# Patient Record
Sex: Male | Born: 1980 | Race: Black or African American | Hispanic: No | Marital: Married | Smoking: Never smoker
Health system: Southern US, Community
[De-identification: ages and names within clinical notes are randomized; demographics above are authoritative.]

## PROBLEM LIST (undated history)

## (undated) DIAGNOSIS — G4733 Obstructive sleep apnea (adult) (pediatric): Secondary | ICD-10-CM

## (undated) DIAGNOSIS — I83893 Varicose veins of bilateral lower extremities with other complications: Secondary | ICD-10-CM

## (undated) DIAGNOSIS — I10 Essential (primary) hypertension: Secondary | ICD-10-CM

## (undated) DIAGNOSIS — N529 Male erectile dysfunction, unspecified: Secondary | ICD-10-CM

## (undated) DIAGNOSIS — Z9989 Dependence on other enabling machines and devices: Secondary | ICD-10-CM

## (undated) DIAGNOSIS — E119 Type 2 diabetes mellitus without complications: Secondary | ICD-10-CM

## (undated) HISTORY — DX: Morbid (severe) obesity due to excess calories: E66.01

## (undated) HISTORY — DX: Obstructive sleep apnea (adult) (pediatric): Z99.89

## (undated) HISTORY — DX: Varicose veins of bilateral lower extremities with other complications: I83.893

## (undated) HISTORY — DX: Male erectile dysfunction, unspecified: N52.9

## (undated) HISTORY — DX: Obstructive sleep apnea (adult) (pediatric): G47.33

---
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
## 2003-11-28 ENCOUNTER — Ambulatory Visit (HOSPITAL_COMMUNITY): Admission: RE | Admit: 2003-11-28 | Discharge: 2003-11-28 | Payer: Self-pay | Admitting: Family Medicine

## 2003-11-28 ENCOUNTER — Emergency Department (HOSPITAL_COMMUNITY): Admission: EM | Admit: 2003-11-28 | Discharge: 2003-11-28 | Payer: Self-pay | Admitting: Family Medicine

## 2003-12-01 ENCOUNTER — Emergency Department (HOSPITAL_COMMUNITY): Admission: EM | Admit: 2003-12-01 | Discharge: 2003-12-01 | Payer: Self-pay | Admitting: Family Medicine

## 2005-02-05 ENCOUNTER — Emergency Department (HOSPITAL_COMMUNITY): Admission: EM | Admit: 2005-02-05 | Discharge: 2005-02-05 | Payer: Self-pay | Admitting: Family Medicine

## 2005-09-27 ENCOUNTER — Emergency Department (HOSPITAL_COMMUNITY): Admission: EM | Admit: 2005-09-27 | Discharge: 2005-09-27 | Payer: Self-pay | Admitting: Family Medicine

## 2006-03-27 ENCOUNTER — Emergency Department (HOSPITAL_COMMUNITY): Admission: EM | Admit: 2006-03-27 | Discharge: 2006-03-27 | Payer: Self-pay | Admitting: Family Medicine

## 2007-09-15 ENCOUNTER — Emergency Department (HOSPITAL_COMMUNITY): Admission: EM | Admit: 2007-09-15 | Discharge: 2007-09-15 | Payer: Self-pay | Admitting: Emergency Medicine

## 2008-10-11 ENCOUNTER — Emergency Department (HOSPITAL_COMMUNITY): Admission: EM | Admit: 2008-10-11 | Discharge: 2008-10-11 | Payer: Self-pay | Admitting: Family Medicine

## 2008-10-13 ENCOUNTER — Emergency Department (HOSPITAL_COMMUNITY): Admission: EM | Admit: 2008-10-13 | Discharge: 2008-10-13 | Payer: Self-pay | Admitting: Family Medicine

## 2008-10-16 ENCOUNTER — Emergency Department (HOSPITAL_COMMUNITY): Admission: EM | Admit: 2008-10-16 | Discharge: 2008-10-16 | Payer: Self-pay | Admitting: Family Medicine

## 2009-04-26 ENCOUNTER — Emergency Department (HOSPITAL_COMMUNITY): Admission: EM | Admit: 2009-04-26 | Discharge: 2009-04-26 | Payer: Self-pay | Admitting: Family Medicine

## 2009-06-01 ENCOUNTER — Emergency Department (HOSPITAL_COMMUNITY): Admission: EM | Admit: 2009-06-01 | Discharge: 2009-06-01 | Payer: Self-pay | Admitting: Emergency Medicine

## 2009-11-01 ENCOUNTER — Ambulatory Visit: Payer: Self-pay | Admitting: Internal Medicine

## 2009-11-01 DIAGNOSIS — K219 Gastro-esophageal reflux disease without esophagitis: Secondary | ICD-10-CM | POA: Insufficient documentation

## 2009-11-01 DIAGNOSIS — R635 Abnormal weight gain: Secondary | ICD-10-CM | POA: Insufficient documentation

## 2009-11-01 LAB — CONVERTED CEMR LAB
ALT: 19 units/L (ref 0–53)
AST: 23 units/L (ref 0–37)
Albumin: 4.6 g/dL (ref 3.5–5.2)
Alkaline Phosphatase: 50 units/L (ref 39–117)
BUN: 10 mg/dL (ref 6–23)
Basophils Absolute: 0 10*3/uL (ref 0.0–0.1)
Basophils Relative: 0.2 % (ref 0.0–3.0)
Bilirubin, Direct: 0.1 mg/dL (ref 0.0–0.3)
CO2: 33 meq/L — ABNORMAL HIGH (ref 19–32)
Calcium: 9.7 mg/dL (ref 8.4–10.5)
Chloride: 97 meq/L (ref 96–112)
Cholesterol: 167 mg/dL (ref 0–200)
Creatinine, Ser: 1.1 mg/dL (ref 0.4–1.5)
Eosinophils Absolute: 0 10*3/uL (ref 0.0–0.7)
Eosinophils Relative: 1 % (ref 0.0–5.0)
GFR calc non Af Amer: 102.59 mL/min (ref >60)
Glucose, Bld: 94 mg/dL (ref 70–99)
HCT: 44.5 % (ref 39.0–52.0)
HDL: 39.8 mg/dL (ref >39.00)
Hemoglobin: 14.4 g/dL (ref 13.0–17.0)
LDL Cholesterol: 98 mg/dL (ref 0–99)
Lymphocytes Relative: 41.4 % (ref 12.0–46.0)
Lymphs Abs: 1.9 10*3/uL (ref 0.7–4.0)
MCHC: 32.3 g/dL (ref 30.0–36.0)
MCV: 91.6 fL (ref 78.0–100.0)
Monocytes Absolute: 0.3 10*3/uL (ref 0.1–1.0)
Monocytes Relative: 5.6 % (ref 3.0–12.0)
Neutro Abs: 2.3 10*3/uL (ref 1.4–7.7)
Neutrophils Relative %: 51.8 % (ref 43.0–77.0)
Platelets: 218 10*3/uL (ref 150.0–400.0)
Potassium: 4.3 meq/L (ref 3.5–5.1)
RBC: 4.86 M/uL (ref 4.22–5.81)
RDW: 12.3 % (ref 11.5–14.6)
Sodium: 137 meq/L (ref 135–145)
TSH: 1.76 microintl units/mL (ref 0.35–5.50)
Total Bilirubin: 0.7 mg/dL (ref 0.3–1.2)
Total CHOL/HDL Ratio: 4
Total Protein: 7.8 g/dL (ref 6.0–8.3)
Triglycerides: 147 mg/dL (ref 0.0–149.0)
VLDL: 29.4 mg/dL (ref 0.0–40.0)
WBC: 4.5 10*3/uL (ref 4.5–10.5)

## 2009-11-02 ENCOUNTER — Encounter: Payer: Self-pay | Admitting: Internal Medicine

## 2009-12-01 ENCOUNTER — Emergency Department (HOSPITAL_COMMUNITY): Admission: EM | Admit: 2009-12-01 | Discharge: 2009-12-01 | Payer: Self-pay | Admitting: Family Medicine

## 2010-04-29 ENCOUNTER — Emergency Department (HOSPITAL_COMMUNITY): Admission: EM | Admit: 2010-04-29 | Discharge: 2010-04-29 | Payer: Self-pay | Admitting: Family Medicine

## 2010-11-20 NOTE — Letter (Signed)
Summary: Lipid Letter  [facility]  [address] [place], [place] [address]   Phone: [phone]  Fax: [phone]    11/02/2009  Mak Bonny [address] [place], [place]  [address]  Dear Renae Fickle:  We have carefully reviewed your last lipid profile from 11/01/2009 and the results are noted below with a summary of recommendations for lipid management.    Cholesterol:       167     Goal: <200   HDL "good" Cholesterol:   57.84     Goal: >40   LDL "bad" Cholesterol:   98     Goal: <130   Triglycerides:       147.0     Goal: <150    Great results! Also, your other labs are normal too.    TLC Diet (Therapeutic Lifestyle Change): Saturated Fats & Transfatty acids should be kept < 7% of total calories ***Reduce Saturated Fats Polyunstaurated Fat can be up to 10% of total calories Monounsaturated Fat Fat can be up to 20% of total calories Total Fat should be no greater than 25-35% of total calories Carbohydrates should be 50-60% of total calories Protein should be approximately 15% of total calories Fiber should be at least 20-30 grams a day ***Increased fiber may help lower LDL Total Cholesterol should be < 200mg /day Consider adding plant stanol/sterols to diet (example: Benacol spread) ***A higher intake of unsaturated fat may reduce Triglycerides and Increase HDL    Adjunctive Measures (may lower LIPIDS and reduce risk of Heart Attack) include: Aerobic Exercise (20-30 minutes 3-4 times a week) Limit Alcohol Consumption Weight Reduction Aspirin 75-81 mg a day by mouth (if not allergic or contraindicated) Dietary Fiber 20-30 grams a day by mouth     Current Medications:  None If you have any questions, please call. We appreciate being able to work with you.   Sincerely,    [facility] Etta Grandchild MD

## 2010-11-20 NOTE — Assessment & Plan Note (Signed)
Summary: NEW PT-CPX- [facility]   Vital Signs:  Patient profile:   30 year old male Height:      73 inches Weight:      291 pounds BMI:     38.53 O2 Sat:      98 % Temp:     97.9 degrees F oral Pulse rate:   80 / minute Pulse rhythm:   regular Resp:     16 per minute  Nutrition Counseling: Patient's BMI is greater than 25 and therefore counseled on weight management options.  History of Present Illness: New to me he wants a complete physical.  Preventive Screening-Counseling & Management  Alcohol-Tobacco     Alcohol drinks/day: <1     Alcohol type: wine     >5/day in last 3 mos: no     Alcohol Counseling: not indicated; use of alcohol is not excessive or problematic     Feels need to cut down: no     Feels annoyed by complaints: no     Feels guilty re: drinking: no     Needs 'eye opener' in am: no     Smoking Status: never  Caffeine-Diet-Exercise     Does Patient Exercise: yes  Hep-HIV-STD-Contraception     Hepatitis Risk: no risk noted     HIV Risk: no risk noted     STD Risk: no risk noted     TSE monthly: yes     Testicular SE Education/Counseling to perform regular STE  Safety-Violence-Falls     Seat Belt Use: yes     Helmet Use: yes     Firearms in the Home: no firearms in the home     Smoke Detectors: yes     Violence in the Home: no risk noted     Sexual Abuse: no      Sexual History:  currently monogamous.        Drug Use:  never.        Blood Transfusions:  no.    Current Medications (verified): 1)  None  Allergies (verified): No Known Drug Allergies  Past History:  Past Medical History: GERD  Past Surgical History: Denies surgical history  Family History: Family History Hypertension  Social History: Smoking Status:  never Does Patient Exercise:  yes Hepatitis Risk:  no risk noted HIV Risk:  no risk noted STD Risk:  no risk noted Seat Belt Use:  yes Sexual History:  currently monogamous Drug Use:  never Blood  Transfusions:  no  Review of Systems       The patient complains of weight gain and severe indigestion/heartburn.  The patient denies anorexia, fever, weight loss, chest pain, syncope, dyspnea on exertion, peripheral edema, prolonged cough, headaches, hemoptysis, abdominal pain, melena, hematochezia, hematuria, difficulty walking, depression, abnormal bleeding, enlarged lymph nodes, angioedema, and testicular masses.    Physical Exam  General:  alert, well-developed, well-nourished, well-hydrated, appropriate dress, normal appearance, healthy-appearing, cooperative to examination, good hygiene, and overweight-appearing.   Head:  normocephalic, atraumatic, no abnormalities observed, and no abnormalities palpated.   Eyes:  No corneal or conjunctival inflammation noted. EOMI. Perrla. Funduscopic exam benign, without hemorrhages, exudates or papilledema. Vision grossly normal. Mouth:  Oral mucosa and oropharynx without lesions or exudates.  Teeth in good repair. Neck:  supple, full ROM, no masses, no thyromegaly, no thyroid nodules or tenderness, no JVD, normal carotid upstroke, no carotid bruits, no cervical lymphadenopathy, and no neck tenderness.   Chest Wall:  No deformities, masses, tenderness or  gynecomastia noted. Breasts:  No masses or gynecomastia noted Lungs:  Normal respiratory effort, chest expands symmetrically. Lungs are clear to auscultation, no crackles or wheezes. Heart:  Normal rate and regular rhythm. S1 and S2 normal without gallop, murmur, click, rub or other extra sounds. Abdomen:  soft, non-tender, normal bowel sounds, no distention, no masses, no guarding, no rigidity, no rebound tenderness, no abdominal hernia, no inguinal hernia, no hepatomegaly, and no splenomegaly.   Genitalia:  circumcised, no hydrocele, no varicocele, no scrotal masses, no testicular masses or atrophy, no cutaneous lesions, and no urethral discharge.   Msk:  normal ROM, no joint tenderness, no joint  swelling, no joint warmth, no redness over joints, no joint instability, and no crepitation.   Pulses:  R and L carotid,radial,femoral,dorsalis pedis and posterior tibial pulses are full and equal bilaterally Extremities:  No clubbing, cyanosis, edema, or deformity noted with normal full range of motion of all joints.   Neurologic:  No cranial nerve deficits noted. Station and gait are normal. Plantar reflexes are down-going bilaterally. DTRs are symmetrical throughout. Sensory, motor and coordinative functions appear intact. Skin:  turgor normal, color normal, no rashes, no suspicious lesions, no ecchymoses, no petechiae, no purpura, no ulcerations, no edema, and tattoo(s).   Cervical Nodes:  no anterior cervical adenopathy and no posterior cervical adenopathy.   Axillary Nodes:  no R axillary adenopathy and no L axillary adenopathy.   Inguinal Nodes:  no R inguinal adenopathy and no L inguinal adenopathy.   Psych:  Cognition and judgment appear intact. Alert and cooperative with normal attention span and concentration. No apparent delusions, illusions, hallucinations Additional Exam:  EKG is normal.   Impression & Recommendations:  Problem # 1:  ROUTINE GENERAL MEDICAL EXAM@HEALTH  CARE FACL (ICD-V70.0) Assessment New  Orders: Venipuncture (11914) TLB-Lipid Panel (80061-LIPID) TLB-BMP (Basic Metabolic Panel-BMET) (80048-METABOL) TLB-CBC Platelet - w/Differential (85025-CBCD) TLB-Hepatic/Liver Function Pnl (80076-HEPATIC) TLB-TSH (Thyroid Stimulating Hormone) (84443-TSH) EKG w/ Interpretation (93000)  Td Booster: Td (05/12/2002)    Discussed using sunscreen, use of alcohol, drug use, self testicular exam, routine dental care, routine eye care, routine physical exam, seat belts, multiple vitamins, and recommendations for immunizations.  Discussed exercise and checking cholesterol.  Discussed gun safety, safe sex.  Problem # 2:  WEIGHT GAIN, ABNORMAL (ICD-783.1) Assessment:  New  Orders: Venipuncture (78295) TLB-Lipid Panel (80061-LIPID) TLB-BMP (Basic Metabolic Panel-BMET) (80048-METABOL) TLB-CBC Platelet - w/Differential (85025-CBCD) TLB-Hepatic/Liver Function Pnl (80076-HEPATIC) TLB-TSH (Thyroid Stimulating Hormone) (62130-QMV)  Patient Instructions: 1)  Please schedule a follow-up appointment as needed. 2)  It is important that you exercise regularly at least 20 minutes 5 times a week. If you develop chest pain, have severe difficulty breathing, or feel very tired , stop exercising immediately and seek medical attention. 3)  You need to lose weight. Consider a lower calorie diet and regular exercise.  4)  Avoid foods high in acid (tomatoes, citrus juices, spicy foods). Avoid eating within two hours of lying down or before exercising. Do not over eat; try smaller more frequent meals. Elevate head of bed twelve inches when sleeping.   Tetanus/Td Immunization History:    Tetanus/Td # 1:  Td (05/12/2002)

## 2011-05-16 ENCOUNTER — Emergency Department (HOSPITAL_COMMUNITY)
Admission: EM | Admit: 2011-05-16 | Discharge: 2011-05-16 | Disposition: A | Discharge status: Home / Self Care | Payer: No Typology Code available for payment source | Attending: Emergency Medicine | Admitting: Emergency Medicine

## 2011-05-16 ENCOUNTER — Emergency Department (HOSPITAL_COMMUNITY): Payer: No Typology Code available for payment source

## 2011-05-16 DIAGNOSIS — R079 Chest pain, unspecified: Secondary | ICD-10-CM | POA: Insufficient documentation

## 2011-05-16 DIAGNOSIS — M542 Cervicalgia: Secondary | ICD-10-CM | POA: Insufficient documentation

## 2011-05-16 DIAGNOSIS — M549 Dorsalgia, unspecified: Secondary | ICD-10-CM | POA: Insufficient documentation

## 2011-05-20 ENCOUNTER — Ambulatory Visit
Admission: RE | Admit: 2011-05-20 | Discharge: 2011-05-20 | Disposition: A | Discharge status: Home / Self Care | Payer: BC Managed Care – PPO | Source: Ambulatory Visit | Attending: Emergency Medicine | Admitting: Emergency Medicine

## 2011-05-20 ENCOUNTER — Other Ambulatory Visit: Payer: Self-pay | Admitting: Emergency Medicine

## 2011-07-26 LAB — COMPREHENSIVE METABOLIC PANEL
ALT: 20 U/L (ref 0–53)
Alkaline Phosphatase: 40 U/L (ref 39–117)
CO2: 27 mEq/L (ref 19–32)
Chloride: 102 mEq/L (ref 96–112)
GFR calc non Af Amer: 59 mL/min — ABNORMAL LOW (ref >60)
Glucose, Bld: 116 mg/dL — ABNORMAL HIGH (ref 70–99)
Potassium: 3.6 mEq/L (ref 3.5–5.1)
Sodium: 137 mEq/L (ref 135–145)
Total Bilirubin: 0.6 mg/dL (ref 0.3–1.2)
Total Protein: 7 g/dL (ref 6.0–8.3)

## 2011-07-26 LAB — CULTURE, BLOOD (ROUTINE X 2)

## 2011-07-26 LAB — URINALYSIS, ROUTINE W REFLEX MICROSCOPIC
Ketones, ur: 15 mg/dL — AB
Nitrite: NEGATIVE
Protein, ur: NEGATIVE mg/dL
pH: 6.5 (ref 5.0–8.0)

## 2011-07-26 LAB — CBC
Hemoglobin: 12.7 g/dL — ABNORMAL LOW (ref 13.0–17.0)
RBC: 4.32 MIL/uL (ref 4.22–5.81)

## 2011-07-26 LAB — DIFFERENTIAL
Basophils Relative: 0 % (ref 0–1)
Eosinophils Absolute: 0.1 10*3/uL (ref 0.0–0.7)
Monocytes Relative: 11 % (ref 3–12)
Neutrophils Relative %: 84 % — ABNORMAL HIGH (ref 43–77)

## 2011-07-26 LAB — POCT I-STAT, CHEM 8
Calcium, Ion: 1.1 mmol/L — ABNORMAL LOW (ref 1.12–1.32)
Chloride: 100 mEq/L (ref 96–112)
HCT: 41 % (ref 39.0–52.0)
Hemoglobin: 13.9 g/dL (ref 13.0–17.0)
TCO2: 26 mmol/L (ref 0–100)

## 2011-07-26 LAB — CULTURE, ROUTINE-ABSCESS

## 2012-05-08 ENCOUNTER — Ambulatory Visit (INDEPENDENT_AMBULATORY_CARE_PROVIDER_SITE_OTHER): Payer: BC Managed Care – PPO | Admitting: Family Medicine

## 2012-05-08 VITALS — BP 118/72 | HR 86 | Temp 98.5°F | Resp 16 | Ht 73.0 in | Wt 309.0 lb

## 2012-05-08 DIAGNOSIS — J029 Acute pharyngitis, unspecified: Secondary | ICD-10-CM

## 2012-05-08 LAB — POCT RAPID STREP A (OFFICE): Rapid Strep A Screen: NEGATIVE

## 2012-05-08 MED ORDER — PREDNISONE 20 MG PO TABS: ORAL_TABLET | ORAL | Status: DC

## 2012-05-08 NOTE — Progress Notes (Signed)
Subjective: 30 year old male with history of sore throat for 3 days. They have a 53-month-old newborn home and he wants make sure he doesn't have strep. 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 He has not been running a fever that he knows of. It started in the throat it still hurts a lot in his throat and upper neck anteriorly. He also has sinus congestion and drainage today.  Patient also complains of fatigue and says he is here physical exam some time. We talked about 2 to get that with. It's her nonspecific fatigue,  Objective: Afro-American male in no acute distress. His TMs are normal. Sinuses nontender. Throat mildly erythematous. Has a little bit of pus on the right tonsil. Neck supple without significant nodes but he is tender in the submandibular area. Chest is clear to auscultation. Heart regular without murmurs.    Assessment: Pharyngitis, etiology unclear Fatigue, nonspecific  Plan:  Check strep screen and proceed accordingly Results for orders placed in visit on 05/08/12  POCT RAPID STREP A (OFFICE)      Component Value Range   Rapid Strep A Screen Negative  Negative   Get PE sometime Exercise, watch diet, avoid liquid calories, get sufficient sleep.  Prednisone 20 tid for 2 days.

## 2012-05-08 NOTE — Patient Instructions (Addendum)

## 2013-07-16 ENCOUNTER — Ambulatory Visit: Payer: Self-pay | Admitting: Family Medicine

## 2013-07-16 VITALS — BP 124/80 | HR 86 | Temp 98.1°F | Resp 18 | Ht 73.5 in | Wt 335.0 lb

## 2013-07-16 DIAGNOSIS — Z0289 Encounter for other administrative examinations: Secondary | ICD-10-CM

## 2013-07-16 DIAGNOSIS — Z024 Encounter for examination for driving license: Secondary | ICD-10-CM

## 2013-07-16 NOTE — Progress Notes (Signed)
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  History: No major medical complaints  Past history: No major operations, hospitalizations, or major illnesses. No allergies No regular medications  Social history: Married with 2 children. Drives for [employer]  Review of systems: Constitutional, HEENT, respiratory, cardiovascular, gastrointestinal, genitourinary, muscular skeletal, endocrinologic, neurologic, psychiatric all negative  Overweight male in no acute distress. TMs normal. Eyes PERRLA. Fundi benign. Throat clear. Neck supple without nodes thyromegaly. No carotid bruits. Chest is clear to auscultation. Heart regular without murmurs gallops or arrhythmias.  Abdomen soft without masses or tenderness. Normal male external genitalia. No hernias. Skin unremarkable. Neurologic normal. Neck circumference 19.5  Assessment: Normal DOT physical in a patient with a large neck circumference Obesity  Plan: He is asymptomatic for sleep problems. Stressed with a history of losing weight. Told him this if he does not succeed at that and he develops any sleep symptoms he'll need sleep studies done. He understands the knee. See him back in 2 years. Card completed.

## 2015-09-23 ENCOUNTER — Ambulatory Visit (INDEPENDENT_AMBULATORY_CARE_PROVIDER_SITE_OTHER): Payer: BLUE CROSS/BLUE SHIELD | Admitting: Physician Assistant

## 2015-09-23 VITALS — BP 130/84 | HR 88 | Temp 98.6°F | Resp 16 | Ht 73.5 in | Wt 361.4 lb

## 2015-09-23 DIAGNOSIS — H11003 Unspecified pterygium of eye, bilateral: Secondary | ICD-10-CM | POA: Diagnosis not present

## 2015-09-23 DIAGNOSIS — J029 Acute pharyngitis, unspecified: Secondary | ICD-10-CM

## 2015-09-23 LAB — POCT RAPID STREP A (OFFICE): RAPID STREP A SCREEN: NEGATIVE

## 2015-09-23 MED ORDER — IBUPROFEN 600 MG PO TABS
600.0000 mg | ORAL_TABLET | Freq: Three times a day (TID) | ORAL | Status: DC | PRN
Start: 2015-09-23 — End: 2017-05-29

## 2015-09-23 MED ORDER — CETIRIZINE HCL 10 MG PO TABS: 10.0000 mg | ORAL_TABLET | Freq: Every day | ORAL | Status: DC

## 2015-09-23 NOTE — Patient Instructions (Signed)
Pterygium Excision °Pterygium excision is a surgical procedure to remove a pterygium, which is a noncancerous (benign) fleshy growth on the front surface of the eye. A pterygium can start on the clear outer tissue of the eye (conjunctiva). It spreads to cover the white part of the eye (sclera) and extends onto the clear tissue that covers the pupil (cornea). °You may need this surgery if you have a pterygium that is causing discomfort or affecting your vision. Pterygium excision may be needed if other treatments are not working. You may also choose to have this surgery to improve the appearance of your eye (cosmetic surgery). °LET YOUR HEALTH CARE PROVIDER KNOW ABOUT: °· Any allergies you have. °· All medicines you are taking, including vitamins, herbs, eye drops, creams, and over-the-counter medicines. °· Previous problems you or members of your family have had with the use of anesthetics. °· Any blood disorders you have. °· Previous surgeries you have had. °· Any medical conditions you may have. °RISKS AND COMPLICATIONS °Generally, this is a safe procedure. However, problems may occur, including: °· Having the pterygium come back after surgery. °· Eye pain. °· Vision changes (astigmatism). °· Feeling like there is something in your eye. °· Scar formation on the eye (granuloma). °BEFORE THE PROCEDURE °· Ask your health care provider about: °¨ Changing or stopping your regular medicines. This is especially important if you are taking diabetes medicines or blood thinners. °¨ Taking medicines such as aspirin and ibuprofen. These medicines can thin your blood. Do not take these medicines before your procedure if your health care provider instructs you not to. °· Plan to have someone take you home after the procedure. °· If you go home right after the procedure, plan to have someone with you for 24 hours. °PROCEDURE °· An IV tube will be inserted into one of your veins. °· You will be given one or more of the  following: °¨ A medicine that helps you relax (sedative). °¨ A medicine that numbs the area (local anesthetic). °· After your eye is numb, your health care provider will use a device (retractor) to hold your eyelid open. °· The pterygium will be removed and lifted away from your eye. °· You may have a piece of eye tissue (graft) attached to the surface of your eye where the pterygium was removed. This graft may be taken from the conjunctiva at the outer lining of your eye on the side that is away from your pterygium excision. °· The graft may be held in place with tiny absorbable stitches (sutures) or with a type of glue. °· Your eye will be closed, and an eye patch will be placed over your eye. °The procedure may vary among health care providers and hospitals. °AFTER THE PROCEDURE °· Your blood pressure, heart rate, breathing rate, and blood oxygen level will be monitored often until the medicines you were given have worn off.  °· You will be given pain medicine as needed. °· You will need to wear your eye patch as directed by your health care provider. °  °This information is not intended to replace advice given to you by your health care provider. Make sure you discuss any questions you have with your health care provider. °  °Document Released: 07/02/2001 Document Revised: 02/21/2015 Document Reviewed: 06/22/2014 °Elsevier Interactive Patient Education ©2016 [employer] ° °

## 2015-09-23 NOTE — Progress Notes (Signed)
09/23/2015 7:03 PM   DOB: 04-22-1981 / MRN: [id number]  SUBJECTIVE:  Kyle Marsh is a 34 y.o. male presenting for for the evaluation of sore throat that started 2 weeks ago.  Associated symptoms include congestion and sneezing today and he denies cough. Treatments tried thus far include nothing with poor relief. He denies sick contacts. No personal history of HTN and diabetes.   He complains of white spots in his eye that appeared on the right eye two days ago.  Denies any frank eye pain and photophobia, nausea and emesis.    He has No Known Allergies.   He  has no past medical history on file.    He  reports that he has never smoked. He does not have any smokeless tobacco history on file. He  has no sexual activity history on file. The patient  has no past surgical history on file.  His family history is not on file.  Review of Systems  Constitutional: Negative for fever and chills.  Eyes: Negative for blurred vision, double vision, photophobia, pain, discharge and redness.  Respiratory: Negative for cough and shortness of breath.   Cardiovascular: Negative for chest pain.  Gastrointestinal: Negative for nausea and abdominal pain.  Genitourinary: Negative for dysuria, urgency and frequency.  Musculoskeletal: Negative for myalgias.  Skin: Negative for rash.  Neurological: Negative for dizziness, tingling and headaches.  Psychiatric/Behavioral: Negative for depression. The patient is not nervous/anxious.     Problem list and medications reviewed and updated by myself where necessary, and exist elsewhere in the encounter.   OBJECTIVE:  BP 130/84 mmHg  Pulse 88  Temp(Src) 98.6 F (37 C) (Oral)  Resp 16  Ht 6' 1.5" (1.867 m)  Wt 361 lb 6.4 oz (163.93 kg)  BMI 47.03 kg/m2  SpO2 96%   Physical Exam  Constitutional: He is oriented to person, place, and time. He appears well-developed.  HENT:  Right Ear: Hearing, tympanic membrane and ear canal normal.  Left Ear: Hearing,  tympanic membrane and ear canal normal.  Mouth/Throat: Uvula is midline and mucous membranes are normal.  Eyes: Conjunctivae, EOM and lids are normal. Pupils are equal, round, and reactive to light. Right eye exhibits no discharge. Left eye exhibits no discharge. No scleral icterus.  Fundoscopic exam:      The right eye shows no AV nicking, no exudate and no hemorrhage.       The left eye shows no AV nicking, no exudate and no hemorrhage.  Slit lamp exam:      The right eye shows no corneal abrasion, no corneal flare, no corneal ulcer and no fluorescein uptake.    Neck: Trachea normal and normal range of motion. Neck supple. Carotid bruit is not present.  Cardiovascular: Normal rate and normal pulses.   No murmur heard. Pulmonary/Chest: Effort normal and breath sounds normal. He has no rhonchi.  Abdominal: Normal appearance. He exhibits no abdominal bruit. There is no tenderness.  Musculoskeletal: Normal range of motion. He exhibits no edema or tenderness.  Lymphadenopathy:       Head (right side): No submental, no submandibular, no tonsillar, no preauricular, no posterior auricular and no occipital adenopathy present.       Head (left side): No submental, no submandibular, no tonsillar, no preauricular, no posterior auricular and no occipital adenopathy present.    He has no cervical adenopathy.  Neurological: He is alert and oriented to person, place, and time. He has normal strength. No cranial nerve deficit or  sensory deficit. Coordination and gait normal.  Skin: Skin is warm, dry and intact. No lesion and no rash noted.  Psychiatric: He has a normal mood and affect. His speech is normal and behavior is normal. Judgment and thought content normal.    Results for orders placed or performed in visit on 09/23/15 (from the past 48 hour(s))  POCT rapid strep A     Status: Normal   Collection Time: 09/23/15  4:42 PM  Result Value Ref Range   Rapid Strep A Screen Negative Negative     ASSESSMENT AND PLAN:  Frankey was seen today for sore throat and eye problem.  Diagnoses and all orders for this visit:  Sore throat: Rapid negative thus far and his throat is unimpressive.  This is likely viral in nature.  Will treat symptomatically for now.  Ibuprofen 800 mg and zyrtec 10 mg.  Will make him aware of strep results and will treat accordingly.  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  Advised that he wear sunglasses. Will send to Dr. Dione Booze per patient's request for a routine evaluation per patient request.       The patient was advised to call or return to clinic if he does not see an improvement in symptoms or to seek the care of the closest emergency department if he worsens with the above plan.   Deliah Boston, MHS, PA-C [facility] 09/23/2015 7:03 PM

## 2015-09-26 ENCOUNTER — Other Ambulatory Visit: Payer: Self-pay | Admitting: Physician Assistant

## 2015-09-26 DIAGNOSIS — J020 Streptococcal pharyngitis: Secondary | ICD-10-CM

## 2015-09-26 LAB — CULTURE, GROUP A STREP

## 2015-09-26 MED ORDER — AMOXICILLIN 875 MG PO TABS: 875.0000 mg | ORAL_TABLET | Freq: Two times a day (BID) | ORAL | Status: DC

## 2015-09-27 NOTE — Progress Notes (Signed)
Left message on machine for patient call back.

## 2015-09-27 NOTE — Progress Notes (Signed)
Pt notified of results

## 2016-02-03 ENCOUNTER — Ambulatory Visit (INDEPENDENT_AMBULATORY_CARE_PROVIDER_SITE_OTHER): Payer: BLUE CROSS/BLUE SHIELD | Admitting: Emergency Medicine

## 2016-02-03 ENCOUNTER — Telehealth: Payer: Self-pay

## 2016-02-03 VITALS — BP 130/80 | HR 102 | Temp 97.8°F | Resp 18 | Ht 73.25 in | Wt 373.4 lb

## 2016-02-03 DIAGNOSIS — J020 Streptococcal pharyngitis: Secondary | ICD-10-CM

## 2016-02-03 DIAGNOSIS — J029 Acute pharyngitis, unspecified: Secondary | ICD-10-CM | POA: Diagnosis not present

## 2016-02-03 LAB — POCT RAPID STREP A (OFFICE): Rapid Strep A Screen: POSITIVE — AB

## 2016-02-03 MED ORDER — CEFTRIAXONE SODIUM 1 G IJ SOLR
1.0000 g | Freq: Once | INTRAMUSCULAR | Status: AC
  Administered 2016-02-03: 1 g via INTRAMUSCULAR

## 2016-02-03 MED ORDER — AMOXICILLIN-POT CLAVULANATE 875-125 MG PO TABS: 1.0000 | ORAL_TABLET | Freq: Two times a day (BID) | ORAL | Status: DC

## 2016-02-03 NOTE — Patient Instructions (Addendum)
If you start to have difficulty swallowing liquids and unable to keep down fluids please go to the emergency room. If you are continuing to have trouble by Monday morning please return to clinic for reevaluation.   Strep Throat Strep throat is a bacterial infection of the throat. Your health care provider may call the infection tonsillitis or pharyngitis, depending on whether there is swelling in the tonsils or at the back of the throat. Strep throat is most common during the cold months of the year in children who are 205-34 years of age, but it can happen during any season in people of any age. This infection is spread from person to person (contagious) through coughing, sneezing, or close contact. CAUSES Strep throat is caused by the bacteria called Streptococcus pyogenes. RISK FACTORS This condition is more likely to develop in:  People who spend time in crowded places where the infection can spread easily.  People who have close contact with someone who has strep throat. SYMPTOMS Symptoms of this condition include:  Fever or chills.   Redness, swelling, or pain in the tonsils or throat.  Pain or difficulty when swallowing.  White or yellow spots on the tonsils or throat.  Swollen, tender glands in the neck or under the jaw.  Red rash all over the body (rare). DIAGNOSIS This condition is diagnosed by performing a rapid strep test or by taking a swab of your throat (throat culture test). Results from a rapid strep test are usually ready in a few minutes, but throat culture test results are available after one or two days. TREATMENT This condition is treated with antibiotic medicine. HOME CARE INSTRUCTIONS Medicines  Take over-the-counter and prescription medicines only as told by your health care provider.  Take your antibiotic as told by your health care provider. Do not stop taking the antibiotic even if you start to feel better.  Have family members who also have a sore  throat or fever tested for strep throat. They may need antibiotics if they have the strep infection. Eating and Drinking  Do not share food, drinking cups, or personal items that could cause the infection to spread to other people.  If swallowing is difficult, try eating soft foods until your sore throat feels better.  Drink enough fluid to keep your urine clear or pale yellow. General Instructions  Gargle with a salt-water mixture 3-4 times per day or as needed. To make a salt-water mixture, completely dissolve -1 tsp of salt in 1 cup of warm water.  Make sure that all household members wash their hands well.  Get plenty of rest.  Stay home from school or work until you have been taking antibiotics for 24 hours.  Keep all follow-up visits as told by your health care provider. This is important. SEEK MEDICAL CARE IF:  The glands in your neck continue to get bigger.  You develop a rash, cough, or earache.  You cough up a thick liquid that is green, yellow-brown, or bloody.  You have pain or discomfort that does not get better with medicine.  Your problems seem to be getting worse rather than better.  You have a fever. SEEK IMMEDIATE MEDICAL CARE IF:  You have new symptoms, such as vomiting, severe headache, stiff or painful neck, chest pain, or shortness of breath.  You have severe throat pain, drooling, or changes in your voice.  You have swelling of the neck, or the skin on the neck becomes red and tender.  You have  signs of dehydration, such as fatigue, dry mouth, and decreased urination.  You become increasingly sleepy, or you cannot wake up completely.  Your joints become red or painful.   This information is not intended to replace advice given to you by your health care provider. Make sure you discuss any questions you have with your health care provider.   Document Released: 10/04/2000 Document Revised: 06/28/2015 Document Reviewed: 01/30/2015 Elsevier  Interactive Patient Education 2016 [employer].     IF you received an x-ray today, you will receive an invoice from [facility]. Please contact [facility] at [phone] with questions or concerns regarding your invoice.   IF you received labwork today, you will receive an invoice from [employer]. Please contact [facility] at [phone] with questions or concerns regarding your invoice.   Our billing staff will not be able to assist you with questions regarding bills from these companies.  You will be contacted with the lab results as soon as they are available. The fastest way to get your results is to activate your My Chart account. Instructions are located on the last page of this paperwork. If you have not heard from [place] regarding the results in 2 weeks, please contact this office.

## 2016-02-03 NOTE — Addendum Note (Signed)
Addended by: Lesle ChrisAUB, Zykerria Tanton A on: 02/03/2016 12:41 PM   Modules accepted: Kipp BroodSmartSet

## 2016-02-03 NOTE — Telephone Encounter (Signed)
Called to confirm pt picked up Augmentin rx, per Dr. Cleta Albertsaub. Pt had picked up rx.

## 2016-02-03 NOTE — Addendum Note (Signed)
Addended by: Mila MerryHARRELL, Jossette Zirbel on: 02/03/2016 12:16 PM   Modules accepted: Kipp BroodSmartSet

## 2016-02-03 NOTE — Progress Notes (Signed)
By signing my name below, I, Raven Small, attest that this documentation has been prepared under the direction and in the presence of Lesle Chris, MD.  Electronically Signed: Andrew Au, ED Scribe. 01/29/2016. 11:21 AM.  Chief Complaint:  Chief Complaint  Patient presents with  . Sore Throat    Started 3 days ago-son dx'd with strep this week  . Nasal Congestion  . Fatigue    HPI: Kyle Marsh is a 35 y.o. male who reports to [facility] today complaining of asore throat that began 3 days ago. Pt reports associated difficulty swallowing, nasal congestion, hoarseness and cough. He has taken tylenol, allergy medication and nasal spray. He reports sick contacts at home of his son who is also being treated for strep. He denies hx of allergies. He denies fever.   No past medical history on file. No past surgical history on file. Social History   Social History  . Marital Status: Single    Spouse Name: N/A  . Number of Children: N/A  . Years of Education: N/A   Social History Main Topics  . Smoking status: Never Smoker   . Smokeless tobacco: None  . Alcohol Use: None  . Drug Use: None  . Sexual Activity: Not Asked   Other Topics Concern  . None   Social History Narrative   No family history on file. No Known Allergies Prior to Admission medications   Medication Sig Start Date End Date Taking? Authorizing Provider  acetaminophen (TYLENOL) 325 MG tablet Take 650 mg by mouth every 6 (six) hours as needed.   Yes Historical Provider, MD  cetirizine (ZYRTEC) 10 MG tablet Take 1 tablet (10 mg total) by mouth daily. 09/23/15  Yes Ofilia Neas, PA-C  fluticasone (FLONASE) 50 MCG/ACT nasal spray Place 2 sprays into both nostrils daily.   Yes Historical Provider, MD  ibuprofen (ADVIL,MOTRIN) 600 MG tablet Take 1 tablet (600 mg total) by mouth every 8 (eight) hours as needed. 09/23/15  Yes Ofilia Neas, PA-C  Multiple Vitamin (ONE-A-DAY MENS PO) Take by mouth.   Yes Historical  Provider, MD  amoxicillin (AMOXIL) 875 MG tablet Take 1 tablet (875 mg total) by mouth 2 (two) times daily. Patient not taking: Reported on 02/03/2016 09/26/15   Ofilia Neas, PA-C     ROS: The patient denies fevers, chills, night sweats, unintentional weight loss, chest pain, palpitations, wheezing, dyspnea on exertion, nausea, vomiting, abdominal pain, dysuria, hematuria, melena, numbness, weakness, or tingling.   All other systems have been reviewed and were otherwise negative with the exception of those mentioned in the HPI and as above.    PHYSICAL EXAM: Filed Vitals:   02/03/16 1108  BP: 130/80  Pulse: 102  Temp: 97.8 F (36.6 C)  Resp: 18   Body mass index is 48.9 kg/(m^2).   General: Alert, no acute distress HEENT:  Normocephalic, atraumatic, oropharynx patent.  Hoarseness to voice. Tonsil 3 + more swelling on right with redness Bilateral AC nodes. Eye: Nonie Hoyer [facility] Cardiovascular:  Regular rate and rhythm, no rubs murmurs or gallops.  No Carotid bruits, radial pulse intact. No pedal edema.  Respiratory: Clear to auscultation bilaterally.  No wheezes, rales, or rhonchi.  No cyanosis, no use of accessory musculature Abdominal: No organomegaly, abdomen is soft and non-tender, positive bowel sounds.  No masses. Musculoskeletal: Gait intact. No edema, tenderness Skin: No rashes. Neurologic: Facial musculature symmetric. Psychiatric: Patient acts appropriately throughout our interaction. Lymphatic: No cervical or submandibular lymphadenopathy  LABS: Results for orders placed or performed in visit on 02/03/16  POCT rapid strep A  Result Value Ref Range   Rapid Strep A Screen Positive (A) Negative    ASSESSMENT/PLAN: Patient tested positive for strep. We'll give him 1 g Rocephin along with Augmentin  twice a day. Recheck 48 hours if not significantly better. He is to go to the emergency room if unable to tolerate fluids. He will also be on 2 Aleve twice a day .I  personally performed the services described in this documentation, which was scribed in my presence. The recorded information has been reviewed and is accurate.   Gross sideeffects, risk and benefits, and alternatives of medications d/w patient. Patient is aware that all medications have potential sideeffects and we are unable to predict every sideeffect or drug-drug interaction that may occur.  Lesle ChrisSteven Daub MD 02/03/2016 11:19 AM

## 2017-02-05 DIAGNOSIS — I10 Essential (primary) hypertension: Secondary | ICD-10-CM | POA: Insufficient documentation

## 2017-05-28 ENCOUNTER — Emergency Department (HOSPITAL_COMMUNITY): Payer: PRIVATE HEALTH INSURANCE

## 2017-05-28 ENCOUNTER — Encounter (HOSPITAL_COMMUNITY): Payer: Self-pay | Admitting: Emergency Medicine

## 2017-05-28 DIAGNOSIS — R609 Edema, unspecified: Secondary | ICD-10-CM | POA: Insufficient documentation

## 2017-05-28 DIAGNOSIS — I10 Essential (primary) hypertension: Secondary | ICD-10-CM | POA: Diagnosis not present

## 2017-05-28 DIAGNOSIS — R2243 Localized swelling, mass and lump, lower limb, bilateral: Secondary | ICD-10-CM | POA: Diagnosis present

## 2017-05-28 DIAGNOSIS — Z791 Long term (current) use of non-steroidal anti-inflammatories (NSAID): Secondary | ICD-10-CM | POA: Diagnosis not present

## 2017-05-28 DIAGNOSIS — Z79899 Other long term (current) drug therapy: Secondary | ICD-10-CM | POA: Insufficient documentation

## 2017-05-28 DIAGNOSIS — R05 Cough: Secondary | ICD-10-CM | POA: Diagnosis not present

## 2017-05-28 DIAGNOSIS — E119 Type 2 diabetes mellitus without complications: Secondary | ICD-10-CM | POA: Insufficient documentation

## 2017-05-28 LAB — COMPREHENSIVE METABOLIC PANEL
ALT: 27 U/L (ref 17–63)
ANION GAP: 12 (ref 5–15)
AST: 41 U/L (ref 15–41)
Albumin: 3.5 g/dL (ref 3.5–5.0)
Alkaline Phosphatase: 48 U/L (ref 38–126)
BUN: 19 mg/dL (ref 6–20)
CHLORIDE: 94 mmol/L — AB (ref 101–111)
CO2: 25 mmol/L (ref 22–32)
Calcium: 8.3 mg/dL — ABNORMAL LOW (ref 8.9–10.3)
Creatinine, Ser: 1.34 mg/dL — ABNORMAL HIGH (ref 0.61–1.24)
Glucose, Bld: 124 mg/dL — ABNORMAL HIGH (ref 65–99)
POTASSIUM: 4.1 mmol/L (ref 3.5–5.1)
SODIUM: 131 mmol/L — AB (ref 135–145)
Total Bilirubin: 0.8 mg/dL (ref 0.3–1.2)
Total Protein: 6.6 g/dL (ref 6.5–8.1)

## 2017-05-28 LAB — CBC WITH DIFFERENTIAL/PLATELET
BASOS ABS: 0 10*3/uL (ref 0.0–0.1)
Basophils Relative: 0 %
EOS PCT: 5 %
Eosinophils Absolute: 0.2 10*3/uL (ref 0.0–0.7)
HCT: 35.3 % — ABNORMAL LOW (ref 39.0–52.0)
HEMOGLOBIN: 11.5 g/dL — AB (ref 13.0–17.0)
LYMPHS ABS: 1 10*3/uL (ref 0.7–4.0)
LYMPHS PCT: 20 %
MCH: 27.9 pg (ref 26.0–34.0)
MCHC: 32.6 g/dL (ref 30.0–36.0)
MCV: 85.7 fL (ref 78.0–100.0)
Monocytes Absolute: 0.6 10*3/uL (ref 0.1–1.0)
Monocytes Relative: 13 %
NEUTROS ABS: 3.1 10*3/uL (ref 1.7–7.7)
NEUTROS PCT: 62 %
PLATELETS: 191 10*3/uL (ref 150–400)
RBC: 4.12 MIL/uL — AB (ref 4.22–5.81)
RDW: 13.6 % (ref 11.5–15.5)
WBC: 4.9 10*3/uL (ref 4.0–10.5)

## 2017-05-28 LAB — URINALYSIS, ROUTINE W REFLEX MICROSCOPIC
BILIRUBIN URINE: NEGATIVE
Glucose, UA: NEGATIVE mg/dL
Hgb urine dipstick: NEGATIVE
KETONES UR: NEGATIVE mg/dL
Leukocytes, UA: NEGATIVE
NITRITE: NEGATIVE
PH: 6 (ref 5.0–8.0)
Protein, ur: NEGATIVE mg/dL
SPECIFIC GRAVITY, URINE: 1.02 (ref 1.005–1.030)

## 2017-05-28 LAB — PROTIME-INR
INR: 1.07
Prothrombin Time: 13.9 seconds (ref 11.4–15.2)

## 2017-05-28 LAB — I-STAT CG4 LACTIC ACID, ED: LACTIC ACID, VENOUS: 1.34 mmol/L (ref 0.5–1.9)

## 2017-05-28 MED ORDER — ACETAMINOPHEN 325 MG PO TABS
650.0000 mg | ORAL_TABLET | Freq: Once | ORAL | Status: AC
Start: 2017-05-28 — End: 2017-05-28
  Administered 2017-05-28: 650 mg via ORAL

## 2017-05-28 MED ORDER — ACETAMINOPHEN 325 MG PO TABS
ORAL_TABLET | ORAL | Status: AC
  Filled 2017-05-28: qty 2

## 2017-05-28 NOTE — ED Triage Notes (Signed)
Pt c/o persistent cought, bilateral LE 10/10 pain and difficulty waking, legs are swollen, red and hot to touch, pt had a fever 100.7 on arrival to ED.

## 2017-05-29 ENCOUNTER — Emergency Department (HOSPITAL_BASED_OUTPATIENT_CLINIC_OR_DEPARTMENT_OTHER)
Admit: 2017-05-29 | Discharge: 2017-05-29 | Disposition: A | Discharge status: Home / Self Care | Payer: PRIVATE HEALTH INSURANCE

## 2017-05-29 ENCOUNTER — Emergency Department (HOSPITAL_COMMUNITY): Payer: PRIVATE HEALTH INSURANCE

## 2017-05-29 ENCOUNTER — Other Ambulatory Visit: Payer: Self-pay

## 2017-05-29 ENCOUNTER — Emergency Department (HOSPITAL_COMMUNITY)
Admission: EM | Admit: 2017-05-29 | Discharge: 2017-05-29 | Disposition: A | Discharge status: Home / Self Care | Payer: PRIVATE HEALTH INSURANCE | Attending: Emergency Medicine | Admitting: Emergency Medicine

## 2017-05-29 DIAGNOSIS — R609 Edema, unspecified: Secondary | ICD-10-CM

## 2017-05-29 DIAGNOSIS — M79609 Pain in unspecified limb: Secondary | ICD-10-CM | POA: Diagnosis not present

## 2017-05-29 HISTORY — DX: Type 2 diabetes mellitus without complications: E11.9

## 2017-05-29 HISTORY — DX: Essential (primary) hypertension: I10

## 2017-05-29 LAB — TSH: TSH: 3.393 u[IU]/mL (ref 0.350–4.500)

## 2017-05-29 LAB — BRAIN NATRIURETIC PEPTIDE: B NATRIURETIC PEPTIDE 5: 6.6 pg/mL (ref 0.0–100.0)

## 2017-05-29 LAB — D-DIMER, QUANTITATIVE (NOT AT ARMC): D DIMER QUANT: 0.98 ug{FEU}/mL — AB (ref 0.00–0.50)

## 2017-05-29 MED ORDER — IOPAMIDOL (ISOVUE-370) INJECTION 76%
INTRAVENOUS | Status: AC
  Administered 2017-05-29: 100 mL
  Filled 2017-05-29: qty 100

## 2017-05-29 MED ORDER — CEPHALEXIN 500 MG PO CAPS: 500.0000 mg | ORAL_CAPSULE | Freq: Three times a day (TID) | ORAL | 0 refills | Status: DC

## 2017-05-29 NOTE — ED Notes (Signed)
Patient noted to have hypoxic episodes when sleeping, oxygen saturation dropping to 85%. EDP made aware

## 2017-05-29 NOTE — Progress Notes (Signed)
*  PRELIMINARY RESULTS* Vascular Ultrasound Lower extremity venous duplex has been completed.  Preliminary findings: technically limited due to body habitus. Poor visualization of veins, but no obvious DVT noted bilaterally.    Farrel DemarkJill Eunice, RDMS, RVT  05/29/2017, 8:27 AM

## 2017-05-29 NOTE — ED Provider Notes (Signed)
[facility]-EMERGENCY DEPT Provider Note   CSN: [id number] Arrival date & time: 05/28/17  2118     History   Chief Complaint Chief Complaint  Patient presents with  . Leg Pain    bilateral  . Cough    HPI Kyle Marsh is a 36 y.o. male.  HPI   37 year old male with hx of diabetes and hypertension presenting complaining of bilateral leg swelling and cough. Patient report for the past 5 days he has had persistent nonproductive cough, subjective fever and chills, shortness of breath when he coughs, as well as bilateral leg swelling with tenderness to legs including calves. Symptom has been progressive in nature, moderate in severity. No associated URI symptoms, abdominal pain, nausea vomiting diarrhea or rash. No prior history of PE or DVT, no recent surgery, prolonged bed rest, active cancer or hemoptysis. Patient was seen by his PCP at the start of his complaints 5 days ago. He was placed on furosemide, losartan, and also Bactrim for cyst in his armpit. He had been compliant with his medication but noticed no improvement. He denies any prior history of congestive heart disease. He does endorse generalized fatigue. Wife states patient snort home, but never been diagnosed with obstructive sleep apnea. No history of thyroid disease. Patient denies any strong cardiac history, he is a nonsmoker or drinker.  Past Medical History:  Diagnosis Date  . Diabetes mellitus without complication (HCC)   . Hypertension     Patient Active Problem List   Diagnosis Date Noted  . GERD 11/01/2009  . WEIGHT GAIN, ABNORMAL 11/01/2009    History reviewed. No pertinent surgical history.     Home Medications    Prior to Admission medications   Medication Sig Start Date End Date Taking? Authorizing Provider  acetaminophen (TYLENOL) 325 MG tablet Take 650 mg by mouth every 6 (six) hours as needed.    [provider]  amoxicillin (AMOXIL) 875 MG tablet Take 1 tablet (875 mg total) by mouth 2 (two)  times daily. Patient not taking: Reported on 02/03/2016 09/26/15   Ofilia Neas, PA-C  amoxicillin-clavulanate (AUGMENTIN) 875-125 MG tablet Take 1 tablet by mouth 2 (two) times daily. 02/03/16   Collene Gobble, MD  cetirizine (ZYRTEC) 10 MG tablet Take 1 tablet (10 mg total) by mouth daily. 09/23/15   Ofilia Neas, PA-C  fluticasone (FLONASE) 50 MCG/ACT nasal spray Place 2 sprays into both nostrils daily.    [provider]  ibuprofen (ADVIL,MOTRIN) 600 MG tablet Take 1 tablet (600 mg total) by mouth every 8 (eight) hours as needed. 09/23/15   Ofilia Neas, PA-C  Multiple Vitamin (ONE-A-DAY MENS PO) Take by mouth.    [provider]    Family History History reviewed. No pertinent family history.  Social History Social History  Substance Use Topics  . Smoking status: Never Smoker  . Smokeless tobacco: Never Used  . Alcohol use No     Allergies   Patient has no known allergies.   Review of Systems Review of Systems  All other systems reviewed and are negative.    Physical Exam Updated Vital Signs BP 118/66 (BP Location: Right Arm)   Pulse 88   Temp 99 F (37.2 C) (Oral)   Resp 18   Ht 6\' 1"  (1.854 m)   Wt (!) 179.6 kg (396 lb)   SpO2 100%   BMI 52.25 kg/m   Physical Exam  Constitutional: He is oriented to person, place, and time. He appears well-developed  and well-nourished. No distress.  Obese male laying in bed, coughs each time he talks  HENT:  Head: Atraumatic.  Eyes: Conjunctivae are normal.  Neck: Neck supple. No JVD present.  Cardiovascular: Normal rate, regular rhythm and intact distal pulses.  Exam reveals no gallop and no friction rub.   No murmur heard. Pulmonary/Chest: Effort normal and breath sounds normal. He has no wheezes. He has no rales.  Abdominal: Soft. He exhibits no distension. There is no tenderness.  Musculoskeletal: He exhibits edema (Bilateral lower extremities tense the touch, 1+ pitting edema extended to the  proximal tib-fib. No palpable cords. Mild erythema to bilateral lower extremities).  Neurological: He is alert and oriented to person, place, and time.  Skin: No rash noted.  Psychiatric: He has a normal mood and affect.  Nursing note and vitals reviewed.    ED Treatments / Results  Labs (all labs ordered are listed, but only abnormal results are displayed) Labs Reviewed  COMPREHENSIVE METABOLIC PANEL - Abnormal; Notable for the following:       Result Value   Sodium 131 (*)    Chloride 94 (*)    Glucose, Bld 124 (*)    Creatinine, Ser 1.34 (*)    Calcium 8.3 (*)    All other components within normal limits  CBC WITH DIFFERENTIAL/PLATELET - Abnormal; Notable for the following:    RBC 4.12 (*)    Hemoglobin 11.5 (*)    HCT 35.3 (*)    All other components within normal limits  D-DIMER, QUANTITATIVE (NOT AT [facility]) - Abnormal; Notable for the following:    D-Dimer, Quant 0.98 (*)    All other components within normal limits  CULTURE, BLOOD (ROUTINE X 2)  CULTURE, BLOOD (ROUTINE X 2)  PROTIME-INR  URINALYSIS, ROUTINE W REFLEX MICROSCOPIC  TSH  BRAIN NATRIURETIC PEPTIDE  I-STAT CG4 LACTIC ACID, ED    EKG  EKG Interpretation  Date/Time:  Thursday May 29 2017 10:09:06 EDT Ventricular Rate:  90 PR Interval:    QRS Duration: 90 QT Interval:  361 QTC Calculation: 442 R Axis:   82 Text Interpretation:  Sinus rhythm No STEMI.  Confirmed by Alona Bene [phone]) on 05/29/2017 10:12:39 AM Also confirmed by Alona Bene [phone]), editor Madalyn Rob [phone])  on 05/29/2017 10:31:16 AM       Radiology Dg Chest 2 View  Result Date: 05/28/2017 CLINICAL DATA:  36 y/o  M; suspected sepsis. EXAM: CHEST  2 VIEW COMPARISON:  None. FINDINGS: The heart size and mediastinal contours are within normal limits. Both lungs are clear. The visualized skeletal structures are unremarkable. IMPRESSION: No acute pulmonary process identified. Electronically Signed   By: Mitzi Hansen M.D.    On: 05/28/2017 22:39   Ct Angio Chest Pe W And/or Wo Contrast  Result Date: 05/29/2017 CLINICAL DATA:  Cough and shortness of breath for 2 months. EXAM: CT ANGIOGRAPHY CHEST WITH CONTRAST TECHNIQUE: Multidetector CT imaging of the chest was performed using the standard protocol during bolus administration of intravenous contrast. Multiplanar CT image reconstructions and MIPs were obtained to evaluate the vascular anatomy. CONTRAST:  100 cc Isovue 370. COMPARISON:  CT chest 05/20/2011. PA and lateral chest earlier today. FINDINGS: Cardiovascular: Although the study is somewhat limited by bolus timing, no pulmonary embolus is identified. Heart size is normal. No pericardial effusion. Mediastinum/Nodes: No enlarged mediastinal, hilar, or axillary lymph nodes. Thyroid gland, trachea, and esophagus demonstrate no significant findings. Lungs/Pleura: Lungs are clear. No pleural effusion or pneumothorax. Upper Abdomen: There is  fatty infiltration of the liver. Otherwise negative. Musculoskeletal: Negative. Review of the MIP images confirms the above findings. IMPRESSION: Negative for pulmonary embolus.  No acute disease. Fatty infiltration liver. Electronically Signed   By: Drusilla Kannerhomas  Dalessio M.D.   On: 05/29/2017 10:46    Procedures Procedures (including critical care time)  Medications Ordered in ED Medications  acetaminophen (TYLENOL) tablet 650 mg (650 mg Oral Given 05/28/17 2138)  iopamidol (ISOVUE-370) 76 % injection (100 mLs  Contrast Given 05/29/17 1027)     Initial Impression / Assessment and Plan / ED Course  I have reviewed the triage vital signs and the nursing notes.  Pertinent labs & imaging results that were available during my care of the patient were reviewed by me and considered in my medical decision making (see chart for details).     BP (!) 149/80   Pulse 92   Temp 99 F (37.2 C) (Oral)   Resp 18   Ht 6\' 1"  (1.854 m)   Wt (!) 179.6 kg (396 lb)   SpO2 94%   BMI 52.25 kg/m     Final Clinical Impressions(s) / ED Diagnoses   Final diagnoses:  Peripheral edema    New Prescriptions New Prescriptions   CEPHALEXIN (KEFLEX) 500 MG CAPSULE    Take 1 capsule (500 mg total) by mouth 3 (three) times daily.   6:32 AM Patient here with cough, shortness of breath, and bilateral leg swelling. He otherwise does not have any significant risk factor for PE or DVT. D-dimer ordered. He does have significant swelling to his lower extremities, may consider venous Doppler ultrasound. Will check BMP, TSH and additional workup initiated.  Patient has a normal lactic acid, urine shows no signs of intertrigo infection, electrolyte panel restraining a mouth kidney injury with a creatinine of 1.34. His white count is normal, no significant anemia. Chest x-ray is normal no evidence of fluid overload.  Care discussed with DR. Campos.   9:32 AM D-dimer is moderate positive at 0.98, will obtain chest CT angiogram to r/o PE. Patient has normal BNP therefore no suspicion for CHF. His TSH is normal. Normal lactic acid.  11:01 AM DVT studies negative. Chest CT angiogram show no evidence of PE or any other concerning feature. At this time no obvious cause identified for his  lower leg swelling. He does have some erythema to both legs, and this may be due to venous stasis however plan to prescribe additional antibiotics to cover for potential cellulitis causing pain and swelling. He is currently taking Bactrim for his skin abscess. Will add Keflex. While in the ER, patient was quite somnolent, snoring and has several short apneic episodes, easily arousable. Suspect obstructive sleep apnea causing symptoms. I encouraged patient to follow-up with PCP for further evaluation of that as well. Return precaution discussed.   Fayrene Helperran, Kota Ciancio, PA-C 05/29/17 1108    Azalia Bilisampos, Kevin, MD 05/30/17 [phone]

## 2017-05-29 NOTE — Discharge Instructions (Signed)
Please wear compressive hose to help decrease leg swelling.  Keep legs elevated while resting.  Continue taking medications recent prescribed.  Take Keflex with Bactrim for potential leg cellulitis.  I'm concern that you may have signs of obstructive sleep apnea, discuss with your doctor for a potential sleep study.  You do not have blood clots either in your legs or your lungs.

## 2017-05-29 NOTE — ED Notes (Signed)
Patient transported to Ultrasound 

## 2017-06-03 LAB — CULTURE, BLOOD (ROUTINE X 2)
Culture: NO GROWTH
Culture: NO GROWTH
Special Requests: ADEQUATE
Special Requests: ADEQUATE

## 2018-05-21 ENCOUNTER — Ambulatory Visit: Payer: Managed Care, Other (non HMO) | Admitting: Family

## 2018-05-21 ENCOUNTER — Other Ambulatory Visit (INDEPENDENT_AMBULATORY_CARE_PROVIDER_SITE_OTHER): Payer: Managed Care, Other (non HMO)

## 2018-05-21 ENCOUNTER — Encounter: Payer: Self-pay | Admitting: Family

## 2018-05-21 VITALS — BP 148/86 | HR 90 | Temp 97.8°F | Ht 73.0 in | Wt 382.0 lb

## 2018-05-21 DIAGNOSIS — R350 Frequency of micturition: Secondary | ICD-10-CM | POA: Diagnosis not present

## 2018-05-21 DIAGNOSIS — G4730 Sleep apnea, unspecified: Secondary | ICD-10-CM | POA: Diagnosis not present

## 2018-05-21 DIAGNOSIS — R5383 Other fatigue: Secondary | ICD-10-CM | POA: Diagnosis not present

## 2018-05-21 DIAGNOSIS — I10 Essential (primary) hypertension: Secondary | ICD-10-CM

## 2018-05-21 DIAGNOSIS — R739 Hyperglycemia, unspecified: Secondary | ICD-10-CM

## 2018-05-21 DIAGNOSIS — L732 Hidradenitis suppurativa: Secondary | ICD-10-CM

## 2018-05-21 LAB — CBC WITH DIFFERENTIAL/PLATELET
BASOS PCT: 1.2 % (ref 0.0–3.0)
Basophils Absolute: 0.1 10*3/uL (ref 0.0–0.1)
EOS PCT: 1.2 % (ref 0.0–5.0)
Eosinophils Absolute: 0.1 10*3/uL (ref 0.0–0.7)
HEMATOCRIT: 40.5 % (ref 39.0–52.0)
Hemoglobin: 13.1 g/dL (ref 13.0–17.0)
LYMPHS PCT: 37.4 % (ref 12.0–46.0)
Lymphs Abs: 1.8 10*3/uL (ref 0.7–4.0)
MCHC: 32.4 g/dL (ref 30.0–36.0)
MCV: 88.3 fl (ref 78.0–100.0)
MONO ABS: 0.4 10*3/uL (ref 0.1–1.0)
MONOS PCT: 8.3 % (ref 3.0–12.0)
Neutro Abs: 2.5 10*3/uL (ref 1.4–7.7)
Neutrophils Relative %: 51.9 % (ref 43.0–77.0)
Platelets: 210 10*3/uL (ref 150.0–400.0)
RBC: 4.58 Mil/uL (ref 4.22–5.81)
RDW: 14.2 % (ref 11.5–15.5)
WBC: 4.8 10*3/uL (ref 4.0–10.5)

## 2018-05-21 LAB — HEMOGLOBIN A1C: Hgb A1c MFr Bld: 6.1 % (ref 4.6–6.5)

## 2018-05-21 LAB — COMPREHENSIVE METABOLIC PANEL
ALBUMIN: 4.5 g/dL (ref 3.5–5.2)
ALT: 19 U/L (ref 0–53)
AST: 18 U/L (ref 0–37)
Alkaline Phosphatase: 49 U/L (ref 39–117)
BUN: 12 mg/dL (ref 6–23)
CALCIUM: 9.6 mg/dL (ref 8.4–10.5)
CHLORIDE: 96 meq/L (ref 96–112)
CO2: 34 mEq/L — ABNORMAL HIGH (ref 19–32)
Creatinine, Ser: 1.03 mg/dL (ref 0.40–1.50)
GFR: 104.83 mL/min (ref >60.00)
Glucose, Bld: 118 mg/dL — ABNORMAL HIGH (ref 70–99)
POTASSIUM: 4 meq/L (ref 3.5–5.1)
SODIUM: 137 meq/L (ref 135–145)
Total Bilirubin: 0.5 mg/dL (ref 0.2–1.2)
Total Protein: 7.7 g/dL (ref 6.0–8.3)

## 2018-05-21 LAB — TSH: TSH: 2.81 u[IU]/mL (ref 0.35–4.50)

## 2018-05-21 LAB — LIPID PANEL
CHOLESTEROL: 180 mg/dL (ref 0–200)
HDL: 43.5 mg/dL (ref >39.00)
LDL CALC: 118 mg/dL — AB (ref 0–99)
NonHDL: 136.89
Total CHOL/HDL Ratio: 4
Triglycerides: 93 mg/dL (ref 0.0–149.0)
VLDL: 18.6 mg/dL (ref 0.0–40.0)

## 2018-05-21 LAB — PSA: PSA: 0.11 ng/mL (ref 0.10–4.00)

## 2018-05-21 MED ORDER — DOXYCYCLINE HYCLATE 100 MG PO TABS: 100.0000 mg | ORAL_TABLET | Freq: Two times a day (BID) | ORAL | 0 refills | Status: DC

## 2018-05-21 MED ORDER — LOSARTAN POTASSIUM 100 MG PO TABS: 100.0000 mg | ORAL_TABLET | Freq: Every day | ORAL | 0 refills | Status: DC

## 2018-05-21 NOTE — Patient Instructions (Signed)

## 2018-05-21 NOTE — Progress Notes (Signed)
Kyle Marsh is a 37 y.o. male with the following history as recorded in Epic[facility]:  Patient Active Problem List   Diagnosis Date Noted  . Benign hypertension 02/05/2017  . GERD 11/01/2009  . WEIGHT GAIN, ABNORMAL 11/01/2009    Current Outpatient Medications  Medication Sig Dispense Refill  . losartan (COZAAR) 100 MG tablet Take 1 tablet (100 mg total) by mouth daily. 90 tablet 0  . Multiple Vitamin (MULTI VITAMIN MENS PO) Take by mouth.    . doxycycline (VIBRA-TABS) 100 MG tablet Take 1 tablet (100 mg total) by mouth 2 (two) times daily. 20 tablet 0   No current facility-administered medications for this visit.     Allergies: Patient has no known allergies.  Past Medical History:  Diagnosis Date  . Diabetes mellitus without complication ([place])   . Hypertension     History reviewed. No pertinent surgical history.  History reviewed. No pertinent family history.  Social History   Tobacco Use  . Smoking status: Never Smoker  . Smokeless tobacco: Never Used  Substance Use Topics  . Alcohol use: No    Subjective:  Patient presents today as a new patient; accompanied by his wife today; had an episode last week where he woke up suddenly feeling like he had "stopped breathing." Is concerned that he may have had a panic attack but denies any prior history of anxiety; both patient and wife admit he is a "loud snorer." Often feels more tired when he wakes up than when he goes to bed; both wife and patient want to make sure he did not have a heart attack;  Has history of hypertension- has previously been working with [employer];  Admits that he does not check his blood pressure regularly; is having increased urinary symptoms on the fluid pill- does occasionally have urinary accidents at night; history of diabetes but resolved with weight loss.   Objective:  Vitals:   05/21/18 1344  BP: (!) 148/86  Pulse: 90  Temp: 97.8 F (36.6 C)  TempSrc: Oral  SpO2: 95%  Weight: (!) 382 lb  0.6 oz (173.3 kg)  Height: '6\' 1"'  (1.854 m)    General: Well developed, well nourished, in no acute distress  Skin : Warm and dry. C/w hidradenitis right axillary- pustular drainage noted Head: Normocephalic and atraumatic  Eyes: Sclera and conjunctiva clear; pupils round and reactive to light; extraocular movements intact  Ears: External normal; canals clear; tympanic membranes normal  Oropharynx: Pink, supple. No suspicious lesions  Neck: Supple without thyromegaly, adenopathy  Lungs: Respirations unlabored; clear to auscultation bilaterally without wheeze, rales, rhonchi  CVS exam: normal rate and regular rhythm.  Neurologic: Alert and oriented; speech intact; face symmetrical; moves all extremities well; CNII-XII intact without focal deficit  Assessment:  1. Essential hypertension   2. Sleep apnea, unspecified type   3. Other fatigue   4. Hyperglycemia   5. Urinary frequency   6. Right axillary hidradenitis     Plan:  1. Uncontrolled; check EKG- NSR; increase Losartan to 100 mg; hold HCTZ for now; update labs; follow-up in 1 month, sooner prn. 2. Refer for sleep study; encouraged weight loss;  4. Check Hgba1c due to history of diabetes; 5. Hold HCTZ for now; check testosterone, PSA today; follow-up to be determined. 6. Rx for Doxycycline 100 mg bid x 10 days;   Return in about 1 month (around 06/21/2018).  Orders Placed This Encounter  Procedures  . CBC w/Diff    Standing Status:  Future    Number of Occurrences:   1    Standing Expiration Date:   05/21/2019  . Comp Met (CMET)    Standing Status:   Future    Number of Occurrences:   1    Standing Expiration Date:   05/21/2019  . Lipid panel    Standing Status:   Future    Number of Occurrences:   1    Standing Expiration Date:   05/22/2019  . TSH    Standing Status:   Future    Number of Occurrences:   1    Standing Expiration Date:   05/21/2019  . PSA    Standing Status:   Future    Number of Occurrences:   1     Standing Expiration Date:   05/21/2019  . Testosterone, Free, Total, SHBG    Standing Status:   Future    Number of Occurrences:   1    Standing Expiration Date:   05/21/2019  . HgB A1c    Standing Status:   Future    Number of Occurrences:   1    Standing Expiration Date:   05/21/2019  . Ambulatory referral to Neurology    Referral Priority:   Routine    Referral Type:   Consultation    Referral Reason:   Specialty Services Required    Requested Specialty:   Neurology    Number of Visits Requested:   1  . EKG 12-Lead    Requested Prescriptions   Signed Prescriptions Disp Refills  . losartan (COZAAR) 100 MG tablet 90 tablet 0    Sig: Take 1 tablet (100 mg total) by mouth daily.  Marland Kitchen doxycycline (VIBRA-TABS) 100 MG tablet 20 tablet 0    Sig: Take 1 tablet (100 mg total) by mouth 2 (two) times daily.

## 2018-05-22 ENCOUNTER — Other Ambulatory Visit: Payer: Self-pay | Admitting: Family

## 2018-05-22 DIAGNOSIS — R7989 Other specified abnormal findings of blood chemistry: Secondary | ICD-10-CM

## 2018-05-22 LAB — TESTOSTERONE, FREE, TOTAL, SHBG
Sex Hormone Binding: 16.9 nmol/L (ref 16.5–55.9)
TESTOSTERONE FREE: 4.2 pg/mL — AB (ref 8.7–25.1)
Testosterone: 158 ng/dL — ABNORMAL LOW (ref 264–916)

## 2018-06-05 ENCOUNTER — Telehealth: Payer: Self-pay | Admitting: Family

## 2018-06-05 MED ORDER — HYDROCHLOROTHIAZIDE 12.5 MG PO CAPS: 12.5000 mg | ORAL_CAPSULE | Freq: Every day | ORAL | 1 refills | Status: DC

## 2018-06-05 NOTE — Telephone Encounter (Signed)
What is his current blood pressure? Is he feeling swollen? He had originally indicated he didn't like the fluid pill.

## 2018-06-05 NOTE — Addendum Note (Signed)
Addended by: Eustace MooreMURRAY, Rishith Siddoway W on: 06/05/2018 02:54 PM   Modules accepted: Orders

## 2018-06-05 NOTE — Telephone Encounter (Signed)
Copied from CRM [phone]. Topic: Quick Communication - See Telephone Encounter >> Jun 05, 2018 11:34 AM Lorrine KinMcGee, Sayward Horvath B, NT wrote: CRM for notification. See Telephone encounter for: 06/05/18. Wanted to know if Kyle ClockLaura Marsh could prescribed hydrochlorothiazide or something similar? Please advise.  CB#: [phone]

## 2018-06-05 NOTE — Telephone Encounter (Signed)
I will send in HCTZ 12. 5 mg for him for now; keep planned follow up.

## 2018-06-05 NOTE — Telephone Encounter (Signed)
Spoke with patient and info given 

## 2018-06-05 NOTE — Telephone Encounter (Signed)
Spoke with patient. He has not been checking his blood pressure. He did state that he is having swelling in his legs and his knees but that he could also tell a big difference after you made adjustments.

## 2018-06-11 ENCOUNTER — Other Ambulatory Visit: Payer: Self-pay | Admitting: Family

## 2018-06-11 DIAGNOSIS — L0293 Carbuncle, unspecified: Secondary | ICD-10-CM

## 2018-06-11 NOTE — Telephone Encounter (Signed)
Left message to call back in regard to Doxycycline refill - is he having symptoms? If so ,need to review those symptoms.

## 2018-06-11 NOTE — Telephone Encounter (Signed)
Patients wife called and said he has taken the medication for 10 days and the right side under his arm  did heal andunder the left arm  is still open, There is also a hole on the left is not completely closed and started to bleed on yesterday.

## 2018-06-11 NOTE — Telephone Encounter (Signed)
Copied from CRM [phone]. Topic: Quick Communication - Rx Refill/Question >> Jun 11, 2018 11:51 AM Gerrianne ScalePayne, Linh Johannes L wrote: Medication: doxycycline (VIBRA-TABS) 100 MG tablet  Has the patient contacted their pharmacy? No.  I tranfer pt to pharmacy (Agent: If no, request that the patient contact the pharmacy for the refill.) (Agent: If yes, when and what did the pharmacy advise?)  Preferred Pharmacy (with phone number or street name):   [id number]- [place], [place] - [address] [phone] (Phone) [phone] (Fax)    Agent: Please be advised that RX refills may take up to 3 business days. We ask that you follow-up with your pharmacy.

## 2018-06-12 NOTE — Telephone Encounter (Signed)
Please advise. Thanks.  

## 2018-06-15 ENCOUNTER — Telehealth: Payer: Self-pay

## 2018-06-15 NOTE — Telephone Encounter (Signed)
Error

## 2018-06-16 MED ORDER — DOXYCYCLINE HYCLATE 100 MG PO TABS: 100.0000 mg | ORAL_TABLET | Freq: Two times a day (BID) | ORAL | 0 refills | Status: DC

## 2018-06-16 NOTE — Telephone Encounter (Signed)
Did you need to see him again prior to refilling?

## 2018-06-16 NOTE — Telephone Encounter (Signed)
Called and left message for patient that script had been sent in.

## 2018-06-16 NOTE — Telephone Encounter (Signed)
Let me know if you want him to come in and I will see about getting him in this week. I know he called last week about refill for antibiotic but didn't know if you were okay with refilling or not?

## 2018-06-16 NOTE — Addendum Note (Signed)
Addended by: Eustace MooreMURRAY, LAURA W on: 06/16/2018 04:41 PM   Modules accepted: Orders

## 2018-06-16 NOTE — Telephone Encounter (Signed)
Pt is calling in requesting a refill on antibiotic doxycycline he states he was told he doesn't need a follow up appt.  CB# [phone]

## 2018-06-16 NOTE — Telephone Encounter (Signed)
I will send in a refill on the Doxycycline but I think we should have him see a dermatologist in follow-up; will put in that referral for him.

## 2018-06-26 ENCOUNTER — Ambulatory Visit (INDEPENDENT_AMBULATORY_CARE_PROVIDER_SITE_OTHER)
Admission: RE | Admit: 2018-06-26 | Discharge: 2018-06-26 | Disposition: A | Discharge status: Home / Self Care | Payer: Managed Care, Other (non HMO) | Source: Ambulatory Visit | Attending: Family | Admitting: Family

## 2018-06-26 ENCOUNTER — Other Ambulatory Visit: Payer: Self-pay | Admitting: Family

## 2018-06-26 ENCOUNTER — Encounter: Payer: Self-pay | Admitting: Family

## 2018-06-26 ENCOUNTER — Ambulatory Visit: Payer: Managed Care, Other (non HMO) | Admitting: Family

## 2018-06-26 VITALS — BP 138/82 | HR 103 | Temp 97.8°F | Ht 73.0 in | Wt 383.1 lb

## 2018-06-26 DIAGNOSIS — R7989 Other specified abnormal findings of blood chemistry: Secondary | ICD-10-CM

## 2018-06-26 DIAGNOSIS — I10 Essential (primary) hypertension: Secondary | ICD-10-CM

## 2018-06-26 DIAGNOSIS — R0683 Snoring: Secondary | ICD-10-CM | POA: Diagnosis not present

## 2018-06-26 DIAGNOSIS — Z23 Encounter for immunization: Secondary | ICD-10-CM

## 2018-06-26 DIAGNOSIS — M25561 Pain in right knee: Secondary | ICD-10-CM

## 2018-06-26 DIAGNOSIS — M25562 Pain in left knee: Secondary | ICD-10-CM | POA: Diagnosis not present

## 2018-06-26 MED ORDER — MELOXICAM 15 MG PO TABS: 15.0000 mg | ORAL_TABLET | Freq: Every day | ORAL | 0 refills | Status: DC

## 2018-06-26 MED ORDER — LOSARTAN POTASSIUM-HCTZ 100-12.5 MG PO TABS: 1.0000 | ORAL_TABLET | Freq: Every day | ORAL | 3 refills | Status: DC

## 2018-06-26 MED ORDER — SILDENAFIL CITRATE 100 MG PO TABS: 100.0000 mg | ORAL_TABLET | Freq: Every day | ORAL | 1 refills | Status: DC | PRN

## 2018-06-26 NOTE — Progress Notes (Signed)
Kyle Marsh is a 37 y.o. male with the following history as recorded in EpicCare:  Patient Active Problem List   Diagnosis Date Noted  . Benign hypertension 02/05/2017  . GERD 11/01/2009  . WEIGHT GAIN, ABNORMAL 11/01/2009    Current Outpatient Medications  Medication Sig Dispense Refill  . doxycycline (VIBRA-TABS) 100 MG tablet Take 1 tablet (100 mg total) by mouth 2 (two) times daily. 20 tablet 0  . Multiple Vitamin (MULTI VITAMIN MENS PO) Take by mouth.    . losartan-hydrochlorothiazide (HYZAAR) 100-12.5 MG tablet Take 1 tablet by mouth daily. 30 tablet 3  . sildenafil (VIAGRA) 100 MG tablet Take 1 tablet (100 mg total) by mouth daily as needed for erectile dysfunction. 6 tablet 1   No current facility-administered medications for this visit.     Allergies: Patient has no known allergies.  Past Medical History:  Diagnosis Date  . Diabetes mellitus without complication ([facility])   . Hypertension     History reviewed. No pertinent surgical history.  Family History  Problem Relation Age of Onset  . Arthritis Mother   . Diabetes Mother   . High blood pressure Mother   . Arthritis Father   . High Cholesterol Father   . High blood pressure Father   . Diabetes Maternal Grandmother   . Kidney disease Maternal Grandmother   . Cancer Paternal Grandmother   . Diabetes Paternal Grandmother   . High blood pressure Paternal Grandmother   . Arthritis Paternal Grandfather   . Diabetes Paternal Grandfather     Social History   Tobacco Use  . Smoking status: Never Smoker  . Smokeless tobacco: Never Used  Substance Use Topics  . Alcohol use: No    Subjective:  1 month follow-up on hypertension; feeling very good on current combination of medication; feels better on lower dosage of HCTZ; Denies any chest pain, shortness of breath, blurred vision or headache,  Is scheduled for sleep study later this month- very excited about the potential benefit to getting suspect sleep apnea  treated;  Scheduled to see urology about low testosterone; concerned about persisting ED symptoms;   Also complaining of 1 week history of bilateral knee pain; no known injury;    Objective:  Vitals:   06/26/18 1100  BP: 138/82  Pulse: (!) 103  Temp: 97.8 F (36.6 C)  TempSrc: Oral  SpO2: 92%  Weight: (!) 383 lb 1.3 oz (173.8 kg)  Height: 6\' 1"  (1.854 m)    General: Well developed, well nourished, in no acute distress  Skin : Warm and dry.  Head: Normocephalic and atraumatic  Eyes: Sclera and conjunctiva clear; pupils round and reactive to light; extraocular movements intact  Ears: External normal; canals clear; tympanic membranes normal  Oropharynx: Pink, supple. No suspicious lesions  Neck: Supple without thyromegaly, adenopathy  Lungs: Respirations unlabored; clear to auscultation bilaterally without wheeze, rales, rhonchi  CVS exam: normal rate and regular rhythm.  Neurologic: Alert and oriented; speech intact; face symmetrical; moves all extremities well; CNII-XII intact without focal deficit   Assessment:  1. Essential hypertension   2. Acute pain of both knees   3. Low testosterone in male   4. Snoring     Plan:  1. Control improving; continue Losartan HCT 100/12.5 mg; continue to work on weight loss; follow up in 3 months, sooner pnr. 2. Update X-rays today; will most likely need to refer to sports med.  3. Keep planned follow-up with urology; trial of Viagra in the interim;  4. Keep planned evaluation for sleep study;   Return in about 3 months (around 09/25/2018).  Orders Placed This Encounter  Procedures  . DG Knee Complete 4 Views Left    Standing Status:   Future    Number of Occurrences:   1    Standing Expiration Date:   08/27/2019    Order Specific Question:   Reason for Exam (SYMPTOM  OR DIAGNOSIS REQUIRED)    Answer:   knee pain    Order Specific Question:   Preferred imaging location?    Answer:   [facility]    Order Specific Question:    Radiology Contrast Protocol - do NOT remove file path    Answer:   \\[email or web address]\[email or web address].pdf  . DG Knee Complete 4 Views Right    Standing Status:   Future    Number of Occurrences:   1    Standing Expiration Date:   08/27/2019    Order Specific Question:   Reason for Exam (SYMPTOM  OR DIAGNOSIS REQUIRED)    Answer:   knee pain    Order Specific Question:   Preferred imaging location?    Answer:   [facility]    Order Specific Question:   Radiology Contrast Protocol - do NOT remove file path    Answer:   \\[email or web address].[email or web address]    Requested Prescriptions   Signed Prescriptions Disp Refills  . losartan-hydrochlorothiazide (HYZAAR) 100-12.5 MG tablet 30 tablet 3    Sig: Take 1 tablet by mouth daily.  . sildenafil (VIAGRA) 100 MG tablet 6 tablet 1    Sig: Take 1 tablet (100 mg total) by mouth daily as needed for erectile dysfunction.

## 2018-06-26 NOTE — Addendum Note (Signed)
Addended by: Karma Ganja on: 06/26/2018 11:52 AM   Modules accepted: Orders

## 2018-07-19 ENCOUNTER — Other Ambulatory Visit: Payer: Self-pay | Admitting: Internal Medicine

## 2018-07-20 ENCOUNTER — Ambulatory Visit (INDEPENDENT_AMBULATORY_CARE_PROVIDER_SITE_OTHER): Payer: Managed Care, Other (non HMO) | Admitting: Neurology

## 2018-07-20 ENCOUNTER — Encounter: Payer: Self-pay | Admitting: Neurology

## 2018-07-20 VITALS — BP 168/74 | HR 98 | Ht 73.0 in | Wt 386.0 lb

## 2018-07-20 DIAGNOSIS — R0683 Snoring: Secondary | ICD-10-CM

## 2018-07-20 DIAGNOSIS — R7989 Other specified abnormal findings of blood chemistry: Secondary | ICD-10-CM

## 2018-07-20 DIAGNOSIS — R51 Headache: Secondary | ICD-10-CM

## 2018-07-20 DIAGNOSIS — Z6841 Body Mass Index (BMI) 40.0 and over, adult: Secondary | ICD-10-CM

## 2018-07-20 DIAGNOSIS — R519 Headache, unspecified: Secondary | ICD-10-CM

## 2018-07-20 DIAGNOSIS — R351 Nocturia: Secondary | ICD-10-CM

## 2018-07-20 DIAGNOSIS — R400 Somnolence: Secondary | ICD-10-CM | POA: Diagnosis not present

## 2018-07-20 DIAGNOSIS — Z9189 Other specified personal risk factors, not elsewhere classified: Secondary | ICD-10-CM

## 2018-07-20 NOTE — Patient Instructions (Signed)

## 2018-07-20 NOTE — Progress Notes (Signed)
Subjective:    Patient ID: Kyle Marsh is a 37 y.o. male.  HPI     Huston Foley, MD, PhD [facility] [address] [address] [place], [place] [address]  Dear Kyle Marsh,   I saw your patient, Kyle Marsh, upon your kind request in my sleep clinic today for initial consultation of his sleep disorder, in particular, concern for underlying obstructive sleep apnea. The patient is accompanied by his 96 yo (middle) son today. As you know, Kyle Marsh is a 61 year old right-handed gentleman with an underlying medical history of hypertension, diabetes, reflux disease, low testosterone and morbid obesity with BMI of over 50, who reports snoring and excessive daytime somnolence.his wife has reported pause in his breathing while he is asleep. I reviewed your office note from 06/26/2018. His Epworth sleepiness score is 18 out of 24 today, fatigue score is 45 out of 63. He drinks caffeine in the form of coffee, one cup in the morning typically and soda, 2 servings per day on average. He works as a Naval architect. He knows to pull over and rest when he is sleepy at the wheel. He has worked as a Naval architect for 15 years or so. He is married and lives with his wife and 3 sons, ages 63, 37 and 1. He is a nonsmoker and drinks alcohol occasionally, maybe 1/month. His bedtime is around 9 and rise time is around 2 AM. Has to be at work at 4 and works 10-12 hours daily, Monday through Friday and also some Saturdays. His mother has sleep apnea and uses a CPAP machine successfully. He has significant nocturia about 3 times for average night and has had morning headaches, about twice per week on average, typically dull and achy, does not take medication for it. His weight has fluctuated. He has recently been diagnosed with low testosterone and saw a endocrinologist. He has been started on clomiphene.   His Past Medical History Is Significant For: Past Medical History:  Diagnosis Date  . Diabetes  mellitus without complication (HCC)   . Hypertension     His Past Surgical History Is Significant For: No past surgical history on file.  His Family History Is Significant For: Family History  Problem Relation Age of Onset  . Arthritis Mother   . Diabetes Mother   . High blood pressure Mother   . Arthritis Father   . High Cholesterol Father   . High blood pressure Father   . Diabetes Maternal Grandmother   . Kidney disease Maternal Grandmother   . Cancer Paternal Grandmother   . Diabetes Paternal Grandmother   . High blood pressure Paternal Grandmother   . Arthritis Paternal Grandfather   . Diabetes Paternal Grandfather     His Social History Is Significant For: Social History   Socioeconomic History  . Marital status: Married    Spouse name: Not on file  . Number of children: Not on file  . Years of education: Not on file  . Highest education level: Not on file  Occupational History  . Not on file  Social Needs  . Financial resource strain: Not on file  . Food insecurity:    Worry: Not on file    Inability: Not on file  . Transportation needs:    Medical: Not on file    Non-medical: Not on file  Tobacco Use  . Smoking status: Never Smoker  . Smokeless tobacco: Never Used  Substance and Sexual Activity  . Alcohol  use: No  . Drug use: No  . Sexual activity: Not on file  Lifestyle  . Physical activity:    Days per week: Not on file    Minutes per session: Not on file  . Stress: Not on file  Relationships  . Social connections:    Talks on phone: Not on file    Gets together: Not on file    Attends religious service: Not on file    Active member of club or organization: Not on file    Attends meetings of clubs or organizations: Not on file    Relationship status: Not on file  Other Topics Concern  . Not on file  Social History Narrative  . Not on file    His Allergies Are:  No Known Allergies:   His Current Medications Are:  Outpatient Encounter  Medications as of 07/20/2018  Medication Sig  . clomiPHENE (CLOMID) 50 MG tablet Take 25 mg by mouth daily.  Marland Kitchen losartan-hydrochlorothiazide (HYZAAR) 100-12.5 MG tablet Take 1 tablet by mouth daily.  . meloxicam (MOBIC) 15 MG tablet TAKE 1 TABLET BY MOUTH EVERY DAY  . Multiple Vitamin (MULTI VITAMIN MENS PO) Take by mouth.  . sildenafil (VIAGRA) 100 MG tablet Take 1 tablet (100 mg total) by mouth daily as needed for erectile dysfunction.  . [DISCONTINUED] doxycycline (VIBRA-TABS) 100 MG tablet Take 1 tablet (100 mg total) by mouth 2 (two) times daily.   No facility-administered encounter medications on file as of 07/20/2018.   :  Review of Systems:  Out of a complete 14 point review of systems, all are reviewed and negative with the exception of these symptoms as listed below: Review of Systems  Neurological:       Pt presents today to discuss his sleep. Pt has never had a sleep study but does endorse snoring.  Epworth Sleepiness Scale 0= would never doze 1= slight chance of dozing 2= moderate chance of dozing 3= high chance of dozing  Sitting and reading: 3 Watching TV: 3 Sitting inactive in a public place (ex. Theater or meeting): 3 As a passenger in a car for an hour without a break: 3 Lying down to rest in the afternoon: 3 Sitting and talking to someone: 0 Sitting quietly after lunch (no alcohol): 3 In a car, while stopped in traffic: 0 Total: 18     Objective:  Neurological Exam  Physical Exam Physical Examination:   Vitals:   07/20/18 1553  BP: (!) 168/74  Pulse: 98    General Examination: The patient is a very pleasant 37 y.o. male in no acute distress. He appears well-developed and well-nourished and well groomed.   HEENT: Normocephalic, atraumatic, pupils are equal, round and reactive to light and accommodation. Extraocular tracking is good without limitation to gaze excursion or nystagmus noted. Normal smooth pursuit is noted. Hearing is grossly intact. Face  is symmetric with normal facial animation and normal facial sensation. Speech is clear with no dysarthria noted. There is no hypophonia. There is no lip, neck/head, jaw or voice tremor. Neck is supple with full range of passive and active motion. There are no carotid bruits on auscultation. Oropharynx exam reveals: mild mouth dryness, adequate dental hygiene and marked airway crowding, due to Thicker soft palate, redundant soft palate, tonsils in place about 2+ bilaterally. Mallampati is class III. Neck circumference is 20 inches. He has a minimal overbite. Tongue protrudes centrally and palate elevates symmetrically.  Chest: Clear to auscultation without wheezing, rhonchi or crackles noted.  Heart: S1+S2+0, regular and normal without murmurs, rubs or gallops noted.   Abdomen: Soft, non-tender and non-distended with normal bowel sounds appreciated on auscultation.  Extremities: There is trace pitting edema in the distal lower extremities bilaterally. Pedal pulses are intact.  Skin: Warm and dry without trophic changes noted. There are no varicose veins.  Musculoskeletal: exam reveals no obvious joint deformities, tenderness or joint swelling or erythema.   Neurologically:  Mental status: The patient is awake, alert and oriented in all 4 spheres. His immediate and remote memory, attention, language skills and fund of knowledge are appropriate. There is no evidence of aphasia, agnosia, apraxia or anomia. Speech is clear with normal prosody and enunciation. Thought process is linear. Mood is normal and affect is normal.  Cranial nerves II - XII are as described above under HEENT exam. In addition: shoulder shrug is normal with equal shoulder height noted. Motor exam: Normal bulk, strength and tone is noted. There is no drift, tremor or rebound. Romberg is negative. Reflexes are 2+ throughout. Fine motor skills and coordination: intact with normal finger taps, normal hand movements, normal rapid  alternating patting, normal foot taps and normal foot agility.  Cerebellar testing: No dysmetria or intention tremor on finger to nose testing. Heel to shin is unremarkable bilaterally. There is no truncal or gait ataxia.  Sensory exam: intact to light touch in the upper and lower extremities.  Gait, station and balance: He stands easily. No veering to one side is noted. No leaning to one side is noted. Posture is age-appropriate and stance is narrow based. Gait shows normal stride length and normal pace. No problems turning are noted. Tandem walk is unremarkable.  Assessment and Plan:  In summary, SHUBHAM THACKSTON is a very pleasant 37 y.o.-year old male with an underlying medical history of hypertension, diabetes, reflux disease, low testosterone and morbid obesity with BMI of over 50, whose history and physical exam are concerning for obstructive sleep apnea (OSA). I had a long chat with the patient about my findings and the diagnosis of OSA, its prognosis and treatment options. We talked about medical treatments, surgical interventions and non-pharmacological approaches. I explained in particular the risks and ramifications of untreated moderate to severe OSA, especially with respect to developing cardiovascular disease down the Road, including congestive heart failure, difficult to treat hypertension, cardiac arrhythmias, or stroke. Even type 2 diabetes has, in part, been linked to untreated OSA. Symptoms of untreated OSA include daytime sleepiness, memory problems, mood irritability and mood disorder such as depression and anxiety, lack of energy, as well as recurrent headaches, especially morning headaches. We talked about trying to maintain a healthy lifestyle in general, as well as the importance of weight control. I encouraged the patient to eat healthy, exercise daily and keep well hydrated, to keep a scheduled bedtime and wake time routine, to not skip any meals and eat healthy snacks in between  meals. I advised the patient not to drive when feeling sleepy. I recommended the following at this time: sleep study with potential positive airway pressure titration. (We will score hypopneas at 3%).   I explained the sleep test procedure to the patient and also outlined possible surgical and non-surgical treatment options of OSA, including the use of a custom-made dental device (which would require a referral to a specialist dentist or oral surgeon), upper airway surgical options, such as pillar implants, radiofrequency surgery, tongue base surgery, and UPPP (which would involve a referral to an ENT surgeon). Rarely, jaw  surgery such as mandibular advancement may be considered.  I also explained the CPAP treatment option to the patient, who indicated that she would be willing to try CPAP if the need arises. I explained the importance of being compliant with PAP treatment, not only for insurance purposes but primarily to improve His symptoms, and for the patient's long term health benefit, including to reduce His cardiovascular risks. I answered all his questions today and the patient was in agreement. I plan to see him back after the sleep study is completed and encouraged him to call with any interim questions, concerns, problems or updates.   Thank you very much for allowing me to participate in the care of this nice patient. If I can be of any further assistance to you please do not hesitate to call me at [phone].  Sincerely,   Huston Foley, MD, PhD

## 2018-07-29 ENCOUNTER — Ambulatory Visit: Payer: Self-pay

## 2018-07-29 NOTE — Telephone Encounter (Signed)
Request for clarification of medication Hyzaar received from Pt wife. She was concerned that the pharmacy just gave her husband HCTZ and not the combination. Call was placed to [facility] pharmacist Zollie Scale who explained that combination drug is not available and that pt should have Losartan 30 day supply and that he recently picked up HCTZ.  Call was place to patient who was unaware of the [facility] not being available. He said he was only taking HCTZ. Pt has the losartan 100mg  and the HCTZ 12.5. Pt was educated to take 1daily of each until the combination drug is available.

## 2018-08-10 ENCOUNTER — Telehealth: Payer: Self-pay

## 2018-08-10 NOTE — Telephone Encounter (Signed)
Insurance has denied in lab sleep study request. Do you want to order a HST? 

## 2018-08-11 ENCOUNTER — Other Ambulatory Visit: Payer: Self-pay | Admitting: Neurology

## 2018-08-11 DIAGNOSIS — R0683 Snoring: Secondary | ICD-10-CM

## 2018-08-11 DIAGNOSIS — Z6841 Body Mass Index (BMI) 40.0 and over, adult: Principal | ICD-10-CM

## 2018-08-11 DIAGNOSIS — R51 Headache: Secondary | ICD-10-CM

## 2018-08-11 DIAGNOSIS — R519 Headache, unspecified: Secondary | ICD-10-CM

## 2018-08-11 DIAGNOSIS — R7989 Other specified abnormal findings of blood chemistry: Secondary | ICD-10-CM

## 2018-08-11 DIAGNOSIS — Z9189 Other specified personal risk factors, not elsewhere classified: Secondary | ICD-10-CM

## 2018-08-11 DIAGNOSIS — R400 Somnolence: Secondary | ICD-10-CM

## 2018-08-11 DIAGNOSIS — R351 Nocturia: Secondary | ICD-10-CM

## 2018-08-11 NOTE — Telephone Encounter (Signed)
Order placed for the patient.

## 2018-08-17 ENCOUNTER — Telehealth: Payer: Self-pay | Admitting: Family

## 2018-08-17 NOTE — Telephone Encounter (Signed)
Spoke with patient and he was going to call me back to confirm.

## 2018-08-17 NOTE — Telephone Encounter (Signed)
Please call and check on his blood pressure. I got refill request for HCTZ but he is supposed to be on Losartan HCT; I want to clarify which medications he is taking.

## 2018-08-19 NOTE — Telephone Encounter (Signed)
Spoke with patient and he was taking 2 pills one was the losartan and the other pill was the HCT. He stated pharmacy told him other pill was discontinued and that is why the gave him 2 pills.

## 2018-08-31 NOTE — Telephone Encounter (Signed)
Called and left message for patient to return call to clinic regarding medication.

## 2018-09-07 ENCOUNTER — Ambulatory Visit (INDEPENDENT_AMBULATORY_CARE_PROVIDER_SITE_OTHER): Payer: Managed Care, Other (non HMO) | Admitting: Neurology

## 2018-09-07 DIAGNOSIS — G4733 Obstructive sleep apnea (adult) (pediatric): Secondary | ICD-10-CM

## 2018-09-07 DIAGNOSIS — R51 Headache: Secondary | ICD-10-CM

## 2018-09-07 DIAGNOSIS — R519 Headache, unspecified: Secondary | ICD-10-CM

## 2018-09-07 DIAGNOSIS — R400 Somnolence: Secondary | ICD-10-CM

## 2018-09-07 DIAGNOSIS — Z9189 Other specified personal risk factors, not elsewhere classified: Secondary | ICD-10-CM

## 2018-09-07 DIAGNOSIS — R351 Nocturia: Secondary | ICD-10-CM

## 2018-09-07 DIAGNOSIS — Z6841 Body Mass Index (BMI) 40.0 and over, adult: Secondary | ICD-10-CM

## 2018-09-07 DIAGNOSIS — R7989 Other specified abnormal findings of blood chemistry: Secondary | ICD-10-CM

## 2018-09-07 DIAGNOSIS — R0683 Snoring: Secondary | ICD-10-CM

## 2018-09-07 DIAGNOSIS — G4734 Idiopathic sleep related nonobstructive alveolar hypoventilation: Secondary | ICD-10-CM

## 2018-09-09 ENCOUNTER — Telehealth: Payer: Self-pay

## 2018-09-09 NOTE — Telephone Encounter (Signed)
I called pt to discuss his sleep study results. No answer, left a message asking him to call me back. 

## 2018-09-09 NOTE — Telephone Encounter (Signed)
Pt returned my call. I advised pt that Dr. Frances FurbishAthar reviewed their sleep study results and found that pt has severe osa with severe O2 desaturations. Dr. Frances FurbishAthar recommends that pt start an auto pap at home since pt's insurance will likely deny an in lab titration study. I reviewed PAP compliance expectations with the pt. Pt is agreeable to starting an auto-PAP. I advised pt that an order will be sent to a DME, [facility], and [facility] will call the pt within about one week after they file with the pt's insurance. [facility] will show the pt how to use the machine, fit for masks, and troubleshoot the auto-PAP if needed. A follow up appt was made for insurance purposes with Eber Jonesarolyn, NP on 12/14/18 at 12:45pm. Pt verbalized understanding to arrive 15 minutes early and bring their auto-PAP. A letter with all of this information in it will be mailed to the pt as a reminder.I verified with the pt that the address we have on file is correct.   I also discussed the ONO on auto pap with pt that will be done 1-2 weeks post auto pap start. We will call pt with those results. I reminded pt to aggressively work on weight loss and avoid sleeping on his back.   Pt verbalized understanding of results. Pt had no questions at this time but was encouraged to call back if questions arise. I have sent the orders to [facility] and have received confirmation that they have received the orders.

## 2018-09-09 NOTE — Addendum Note (Signed)
Addended by: Huston FoleyATHAR, Jullian Previti on: 09/09/2018 07:56 AM   Modules accepted: Orders

## 2018-09-09 NOTE — Procedures (Signed)
[facility] [address] [place], [place] [address] NAME:  Kyle Marsh                                                                            DOB: [birth year] MEDICAL RECORD no:  [id number]                                                   DOS: 09/07/2018  REFERRING PHYSICIAN: Allyne GeeLaura Woodruff, FNP STUDY PERFORMED: Home Sleep Test on [facility] HISTORY: 37 year old man with a history of hypertension, diabetes, reflux disease, low testosterone and morbid obesity, who reports snoring and excessive daytime somnolence, as well as pauses in his breathing while asleep. Epworth sleepiness score is 18 out of 24, BMI of 50.9. STUDY RESULTS:  Total Recording Time: 11 hours, 12 minutes (valid test time: 10 hours, 3 min) Total Apnea/Hypopnea Index (AHI):  74.8/h, RDI: 75.3/h Average Oxygen Saturation:   82%,  Lowest Oxygen Desaturation: 50s%  Total Time Oxygen Saturation Below or at 88%: 406 minutes  Average Heart Rate:  84 bpm (between 51 and 124 bpm) IMPRESSION: Severe OSA; Nocturnal hypoxemia RECOMMENDATION:  This home sleep test demonstrates severe obstructive sleep apnea with a total AHI of 74.8/hour and O2 nadir in the 50s (even 46%). He has severe nocturnal hypoxemia. Given the patient's medical history and sleep related complaints, treatment with positive airway pressure (in the form of CPAP) is very strongly recommended. Avoidance of the supine sleep position and aggressive weight loss should be pursued concomitantly. A full night CPAP titration study is recommended to determine proper treatment settings, accurate O2 monitoring and mask fitting. Based on the severity of the sleep disordered breathing an attended titration study is indicated. However, patient's insurance has denied an attended sleep study; therefore, the patient will be advised to proceed with an autoPAP titration/trial at home for now; I will also request an overnight pulse oximetry shortly after he  has the autoPAP machine. Please note that untreated obstructive sleep apnea may carry additional perioperative morbidity. Patients with significant obstructive sleep apnea should receive perioperative PAP therapy and the surgeons and particularly the anesthesiologist should be informed of the diagnosis and the severity of the sleep disordered breathing. The patient should be cautioned not to drive, work at heights, or operate dangerous or heavy equipment when tired or sleepy. Review and reiteration of good sleep hygiene measures should be pursued with any patient. Other causes of the patient's symptoms, including circadian rhythm disturbances, an underlying mood disorder, medication effect and/or an underlying medical problem cannot be ruled out based on this test. Clinical correlation is recommended. The patient and his referring provider will be notified of the test results. The patient will be seen in follow up in [facility].  I certify that I have reviewed the raw data recording prior to the issuance of this report in accordance with the standards of the American Academy of Sleep Medicine (AASM).  Huston FoleySaima Bernadetta Roell, MD, PhD [facility] Diplomat, ABPN (Neurology and Sleep)  I certify that I have reviewed the raw data recording prior to the issuance of this report in accordance with the standards of the American Academy of Sleep Medicine (AASM). Huston Foley, MD, PhD [facility]) Diplomat, ABPN (Neurology and Sleep)

## 2018-09-09 NOTE — Progress Notes (Signed)
Patient referred by Ria ClockLaura Murray, NP, seen by me on 07/20/18, HST on 09/07/18.    Please call and notify the patient that the recent home sleep test showed obstructive sleep apnea in the severe range with severe desaturations and baseline O2 of only 82%. While I recommend treatment for this in the form CPAP, his insurance will not approve a sleep study for this. They will likely only approve a trial of autoPAP, which means, that we don't have to bring him in for a sleep study with CPAP, but will let him try an autoPAP machine at home, through a DME company (of his choice, or as per insurance requirement). The DME representative will educate him on how to use the machine, how to put the mask on, etc. I have placed an order in the chart. Please send referral, talk to patient, send report to referring MD.  I would like to do an ONO about a week after his autoPAP start as well, so there will be 2 orders for DME. Please advise him about his as well.  We will need a FU in sleep clinic for 10 weeks post-PAP set up, please arrange that with me or one of our NPs. Thanks,  Please remind pt to aggressively work on weight loss and avoid sleeping on his back.   Huston FoleySaima Sherion Dooly, MD, PhD [facility])

## 2018-09-09 NOTE — Telephone Encounter (Signed)
-----   Message from Huston FoleySaima Athar, MD sent at 09/09/2018  7:56 AM EST ----- Patient referred by Ria ClockLaura Murray, NP, seen by me on 07/20/18, HST on 09/07/18.    Please call and notify the patient that the recent home sleep test showed obstructive sleep apnea in the severe range with severe desaturations and baseline O2 of only 82%. While I recommend treatment for this in the form CPAP, his insurance will not approve a sleep study for this. They will likely only approve a trial of autoPAP, which means, that we don't have to bring him in for a sleep study with CPAP, but will let him try an autoPAP machine at home, through a DME company (of his choice, or as per insurance requirement). The DME representative will educate him on how to use the machine, how to put the mask on, etc. I have placed an order in the chart. Please send referral, talk to patient, send report to referring MD.  I would like to do an ONO about a week after his autoPAP start as well, so there will be 2 orders for DME. Please advise him about his as well.  We will need a FU in sleep clinic for 10 weeks post-PAP set up, please arrange that with me or one of our NPs. Thanks,  Please remind pt to aggressively work on weight loss and avoid sleeping on his back.   Huston FoleySaima Athar, MD, PhD [facility])

## 2018-09-16 ENCOUNTER — Other Ambulatory Visit: Payer: Self-pay | Admitting: Family

## 2018-09-25 ENCOUNTER — Encounter: Payer: Self-pay | Admitting: Family

## 2018-09-25 ENCOUNTER — Ambulatory Visit (INDEPENDENT_AMBULATORY_CARE_PROVIDER_SITE_OTHER): Payer: Managed Care, Other (non HMO) | Admitting: Family

## 2018-09-25 VITALS — BP 146/90 | HR 93 | Temp 98.1°F | Ht 73.0 in | Wt 393.0 lb

## 2018-09-25 DIAGNOSIS — R7989 Other specified abnormal findings of blood chemistry: Secondary | ICD-10-CM

## 2018-09-25 DIAGNOSIS — Z23 Encounter for immunization: Secondary | ICD-10-CM

## 2018-09-25 DIAGNOSIS — G4730 Sleep apnea, unspecified: Secondary | ICD-10-CM | POA: Diagnosis not present

## 2018-09-25 DIAGNOSIS — I10 Essential (primary) hypertension: Secondary | ICD-10-CM

## 2018-09-25 DIAGNOSIS — M25561 Pain in right knee: Secondary | ICD-10-CM

## 2018-09-25 MED ORDER — LOSARTAN POTASSIUM-HCTZ 100-12.5 MG PO TABS: 1.0000 | ORAL_TABLET | Freq: Every day | ORAL | 3 refills | Status: DC

## 2018-09-25 NOTE — Progress Notes (Signed)
Kyle Marsh is a 37 y.o. male with the following history as recorded in EpicCare:  Patient Active Problem List   Diagnosis Date Noted  . Low testosterone in male 09/25/2018  . Sleep apnea 09/25/2018  . Benign hypertension 02/05/2017  . GERD 11/01/2009  . WEIGHT GAIN, ABNORMAL 11/01/2009    Current Outpatient Medications  Medication Sig Dispense Refill  . clomiPHENE (CLOMID) 50 MG tablet Take 25 mg by mouth daily.    Marland Kitchen losartan-hydrochlorothiazide (HYZAAR) 100-12.5 MG tablet Take 1 tablet by mouth daily. 30 tablet 3  . meloxicam (MOBIC) 15 MG tablet TAKE 1 TABLET BY MOUTH EVERY DAY 30 tablet 0  . Multiple Vitamin (MULTI VITAMIN MENS PO) Take by mouth.    . sildenafil (VIAGRA) 100 MG tablet Take 1 tablet (100 mg total) by mouth daily as needed for erectile dysfunction. 6 tablet 1   No current facility-administered medications for this visit.     Allergies: Patient has no known allergies.  Past Medical History:  Diagnosis Date  . Diabetes mellitus without complication (HCC)   . Hypertension     History reviewed. No pertinent surgical history.  Family History  Problem Relation Age of Onset  . Arthritis Mother   . Diabetes Mother   . High blood pressure Mother   . Arthritis Father   . High Cholesterol Father   . High blood pressure Father   . Diabetes Maternal Grandmother   . Kidney disease Maternal Grandmother   . Cancer Paternal Grandmother   . Diabetes Paternal Grandmother   . High blood pressure Paternal Grandmother   . Arthritis Paternal Grandfather   . Diabetes Paternal Grandfather     Social History   Tobacco Use  . Smoking status: Never Smoker  . Smokeless tobacco: Never Used  Substance Use Topics  . Alcohol use: No    Subjective:  3 month follow-up on hypertension; only on Losartan today; has not been on the HCTZ x 2 weeks; is frustrated that cannot get combination pill at this time; Denies any chest pain, shortness of breath, blurred vision or  headache.  Since last OV, has been diagnosed with low testosterone and sleep apnea; does feel better with addition of Clomid; optimistic about benefits for CPAP- planning to get his machine next week; knows he needs to continue working on losing weight;  Persisting knee pain- did not understand to see sports med in follow-up; limited benefit with Mobic; feels like right knee especially is swollen.    Objective:  Vitals:   09/25/18 1107  BP: (!) 146/90  Pulse: 93  Temp: 98.1 F (36.7 C)  TempSrc: Oral  SpO2: 95%  Weight: (!) 393 lb 0.6 oz (178.3 kg)  Height: 6\' 1"  (1.854 m)    General: Well developed, well nourished, in no acute distress  Skin : Warm and dry.  Head: Normocephalic and atraumatic  Eyes: Sclera and conjunctiva clear; pupils round and reactive to light; extraocular movements intact  Ears: External normal; canals clear; tympanic membranes normal  Oropharynx: Pink, supple. No suspicious lesions  Neck: Supple without thyromegaly, adenopathy  Lungs: Respirations unlabored; clear to auscultation bilaterally without wheeze, rales, rhonchi  CVS exam: normal rate and regular rhythm.  Neurologic: Alert and oriented; speech intact; face symmetrical; moves all extremities well; CNII-XII intact without focal deficit   Assessment:  1. Benign hypertension   2. Low testosterone in male   3. Sleep apnea, unspecified type   4. Right knee pain, unspecified chronicity  Plan:  1. Uncontrolled; re-start Losartan HCT 100/12.5 mg daily; follow-up in 2 months; may need to consider stopping fluid pill but patient not ready to change medications at this time. 2. Improved with Clomid; see urology as scheduled; 3. Will be getting CPAP next week; encouraged weight loss; 4. Follow-up with sports medicine.   Return in about 2 months (around 11/26/2018) for 1 week  sports medicine with Dr. Katrinka BlazingSmith or Dr. Jordan LikesSchmitz.  Orders Placed This Encounter  Procedures  . Tdap vaccine greater than or equal  to 7yo IM    Requested Prescriptions   Signed Prescriptions Disp Refills  . losartan-hydrochlorothiazide (HYZAAR) 100-12.5 MG tablet 30 tablet 3    Sig: Take 1 tablet by mouth daily.

## 2018-10-02 ENCOUNTER — Ambulatory Visit: Payer: Managed Care, Other (non HMO) | Admitting: Family Medicine

## 2018-10-29 ENCOUNTER — Other Ambulatory Visit: Payer: Self-pay | Admitting: Family

## 2018-11-27 ENCOUNTER — Ambulatory Visit: Payer: Managed Care, Other (non HMO) | Admitting: Family

## 2018-12-13 ENCOUNTER — Encounter: Payer: Self-pay | Admitting: Nurse Practitioner

## 2018-12-14 ENCOUNTER — Ambulatory Visit: Payer: Self-pay | Admitting: Nurse Practitioner

## 2019-01-26 IMAGING — DX DG KNEE COMPLETE 4+V*L*
4 series · 4 of 4 positions shown · non-contrast
Comparison: None.

CLINICAL DATA: Diffuse left knee pain for 1 week.  No known injury.

EXAM:
LEFT KNEE - COMPLETE 4+ VIEW

[knee ap]
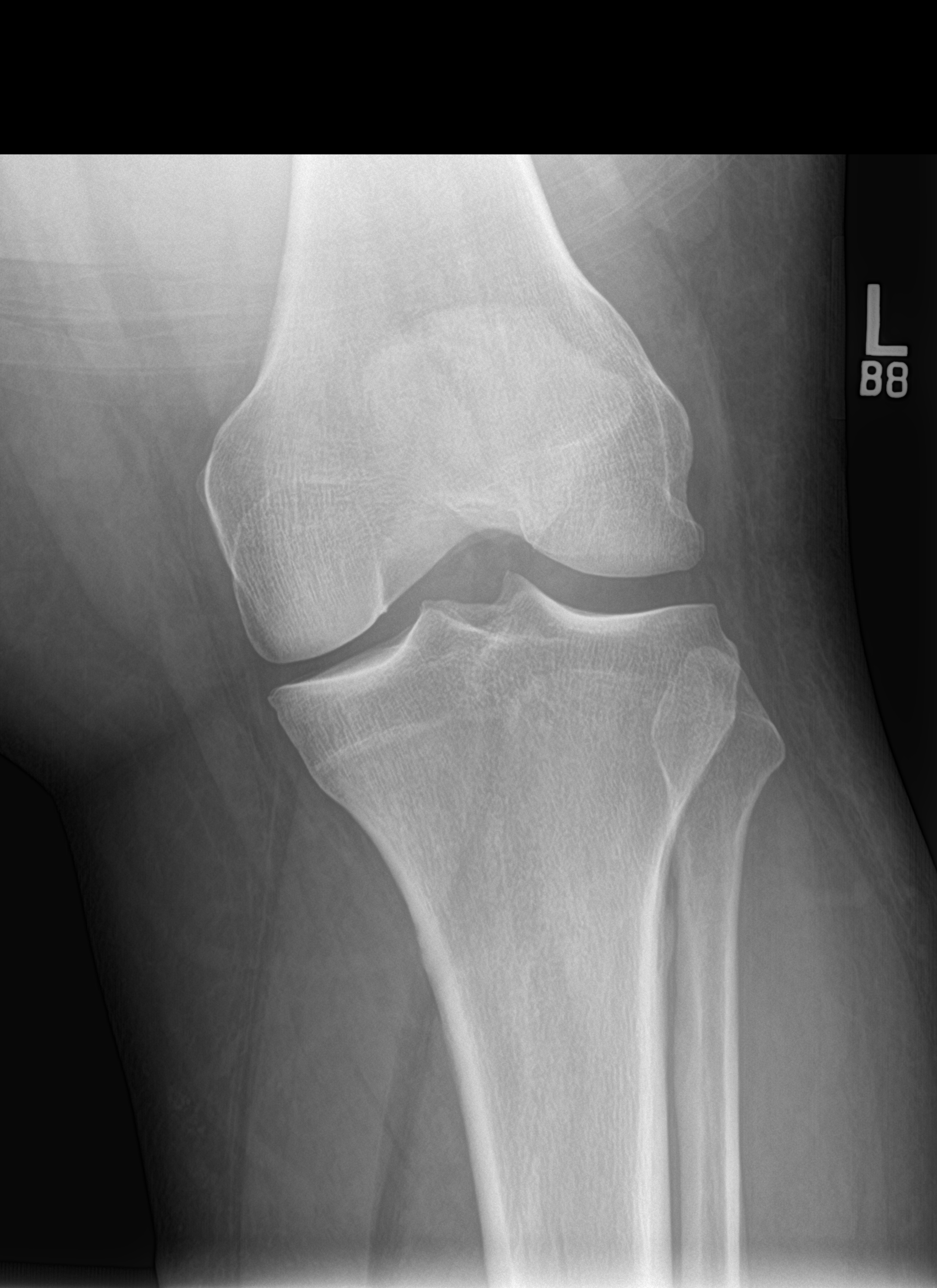

[knee tunnel]
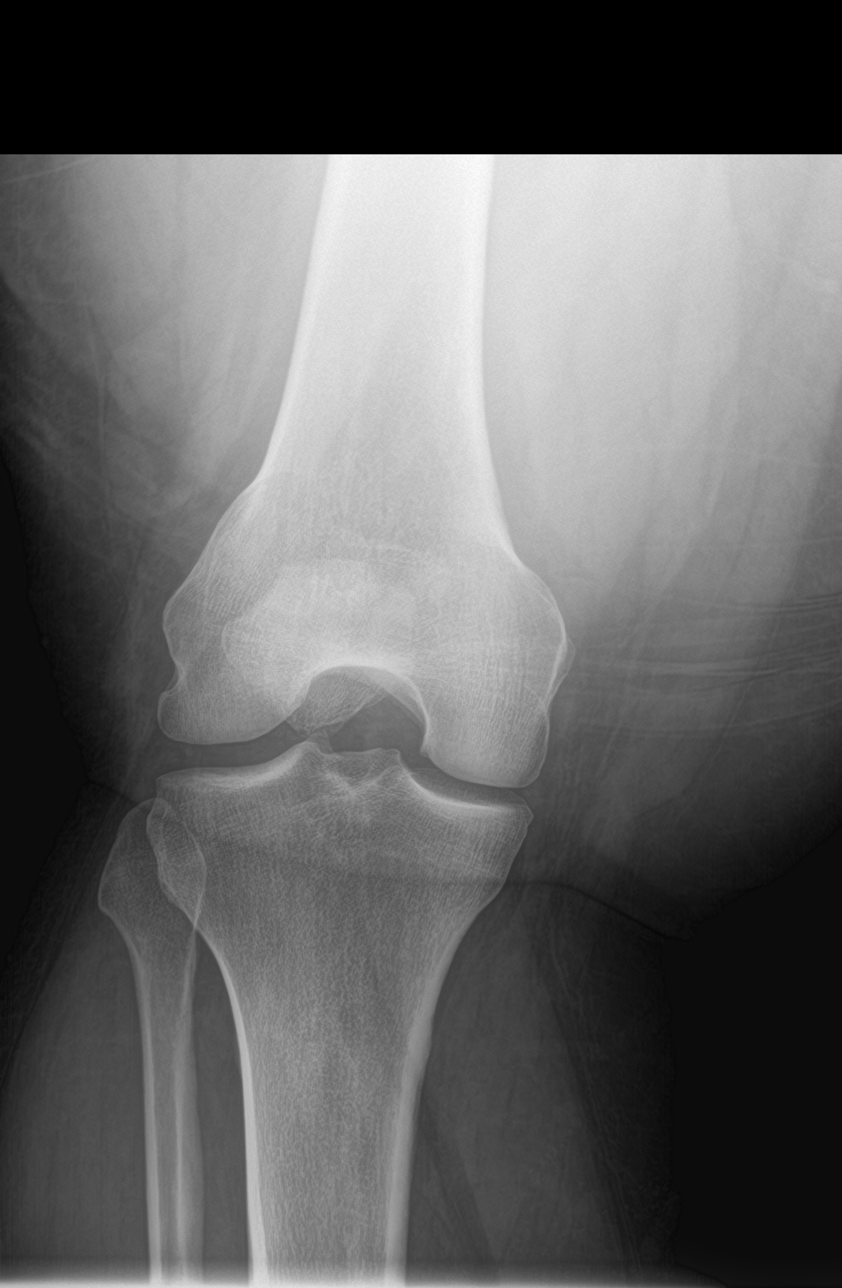

[knee lat]
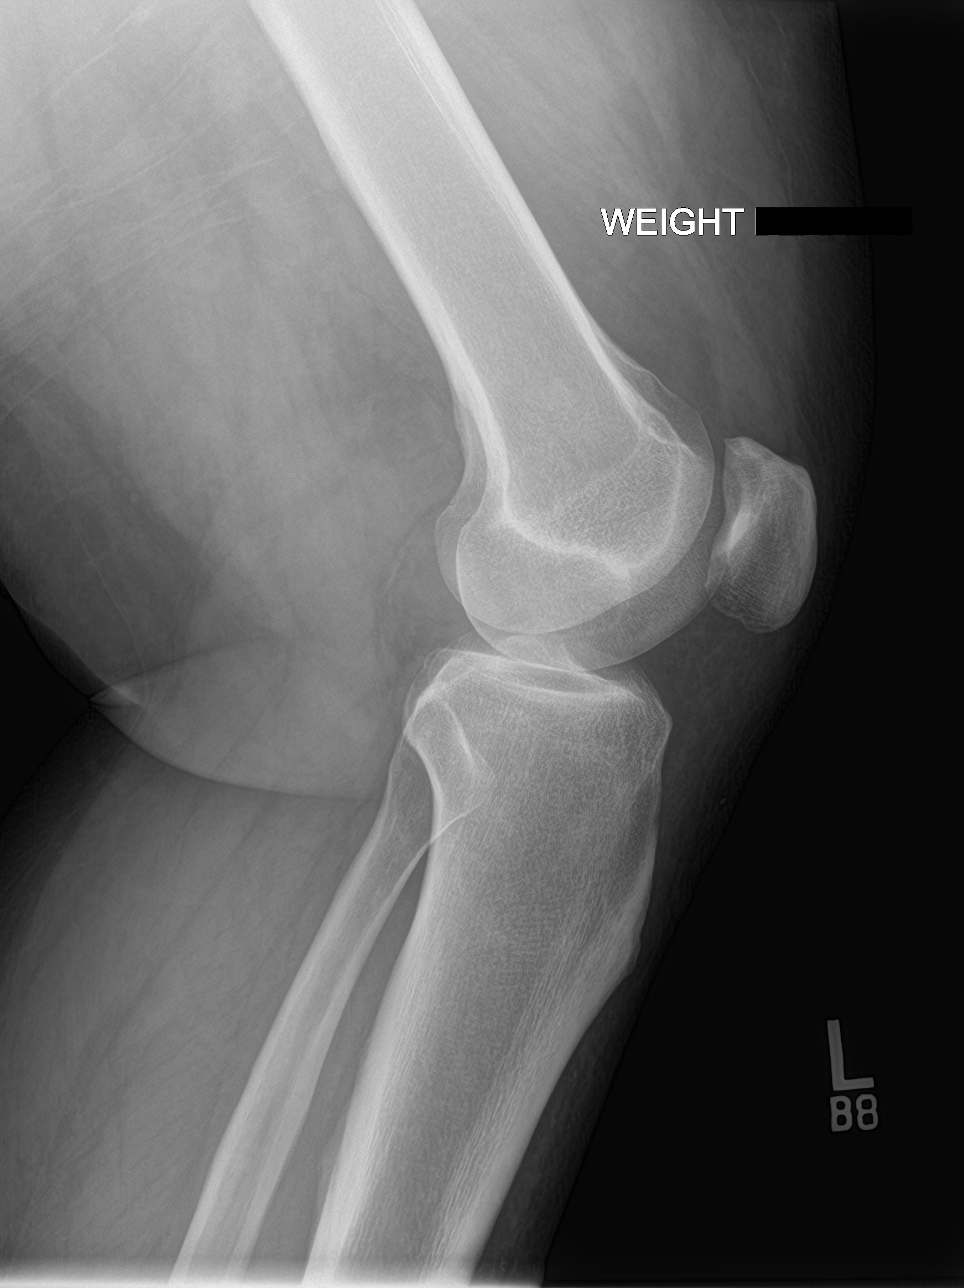

[sunrise]
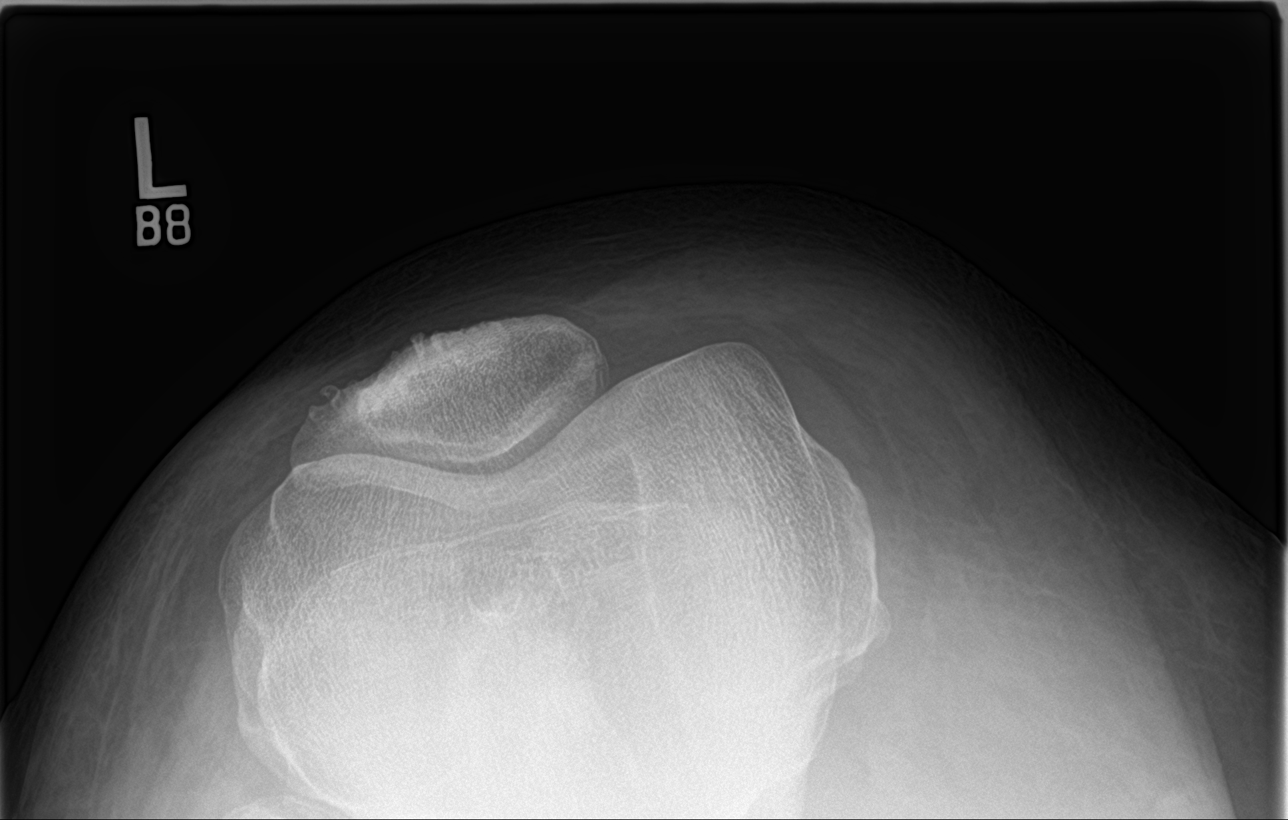

[4 of 4 positions shown; findings below may reference images not displayed]

FINDINGS: Mild medial joint space narrowing and minimal spur formation.
Minimal posterior patellar spur formation. No effusion.
IMPRESSION: Mild medial and minimal patellofemoral degenerative changes.

## 2019-01-26 IMAGING — DX DG KNEE COMPLETE 4+V*R*
4 series · 4 of 4 positions shown · non-contrast
Comparison: None.

CLINICAL DATA: Diffuse right knee pain for 1 week. No known injury.

EXAM:
RIGHT KNEE - COMPLETE 4+ VIEW

[knee ap]
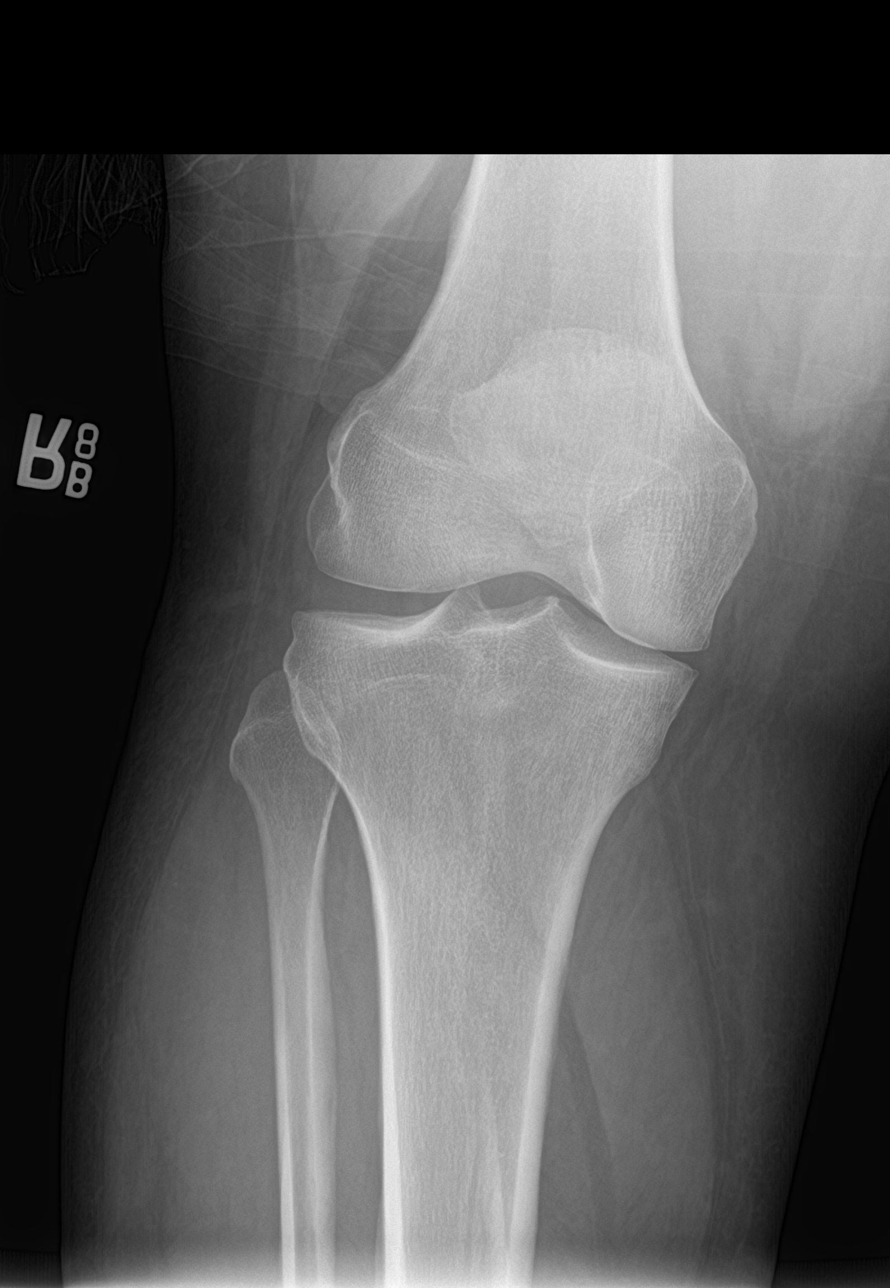

[knee tunnel]
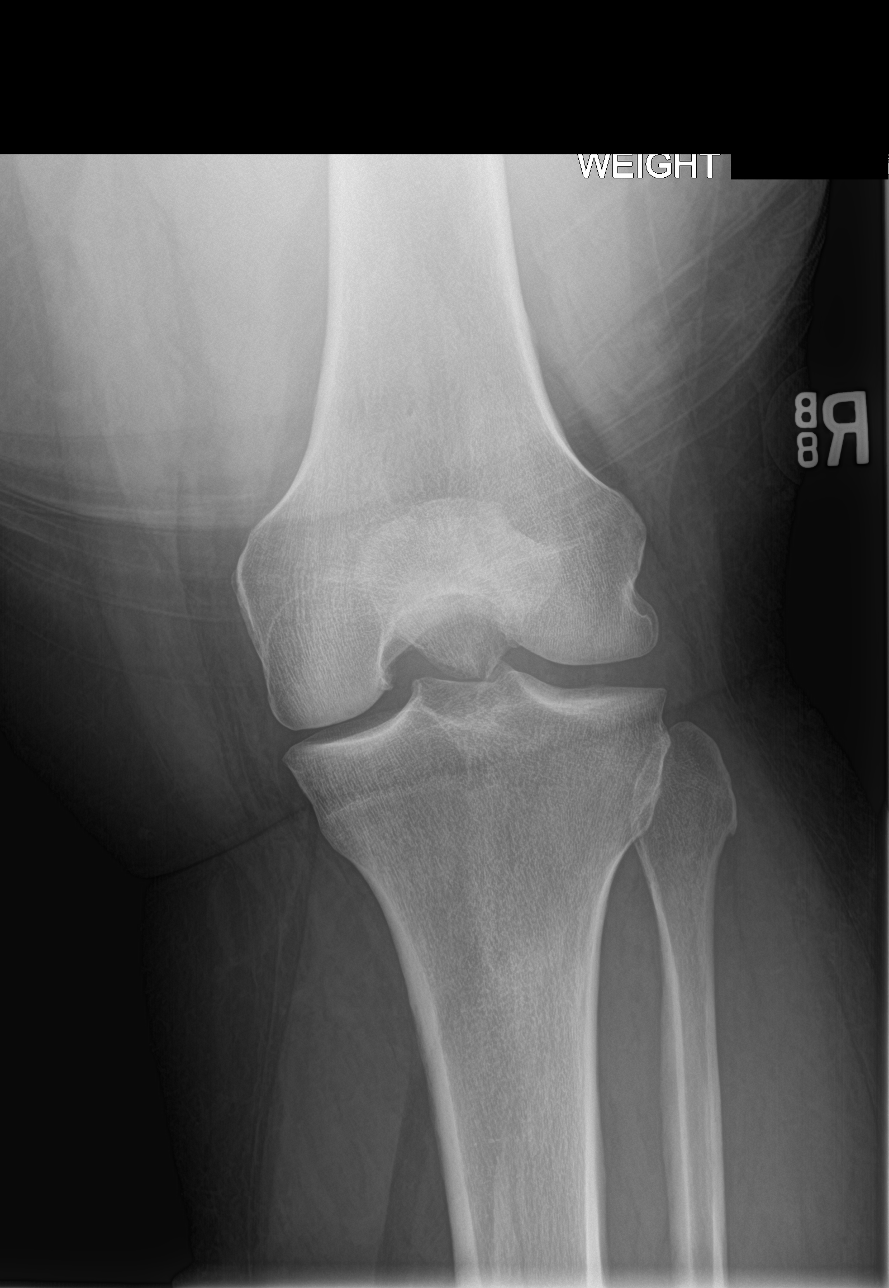

[knee lat]
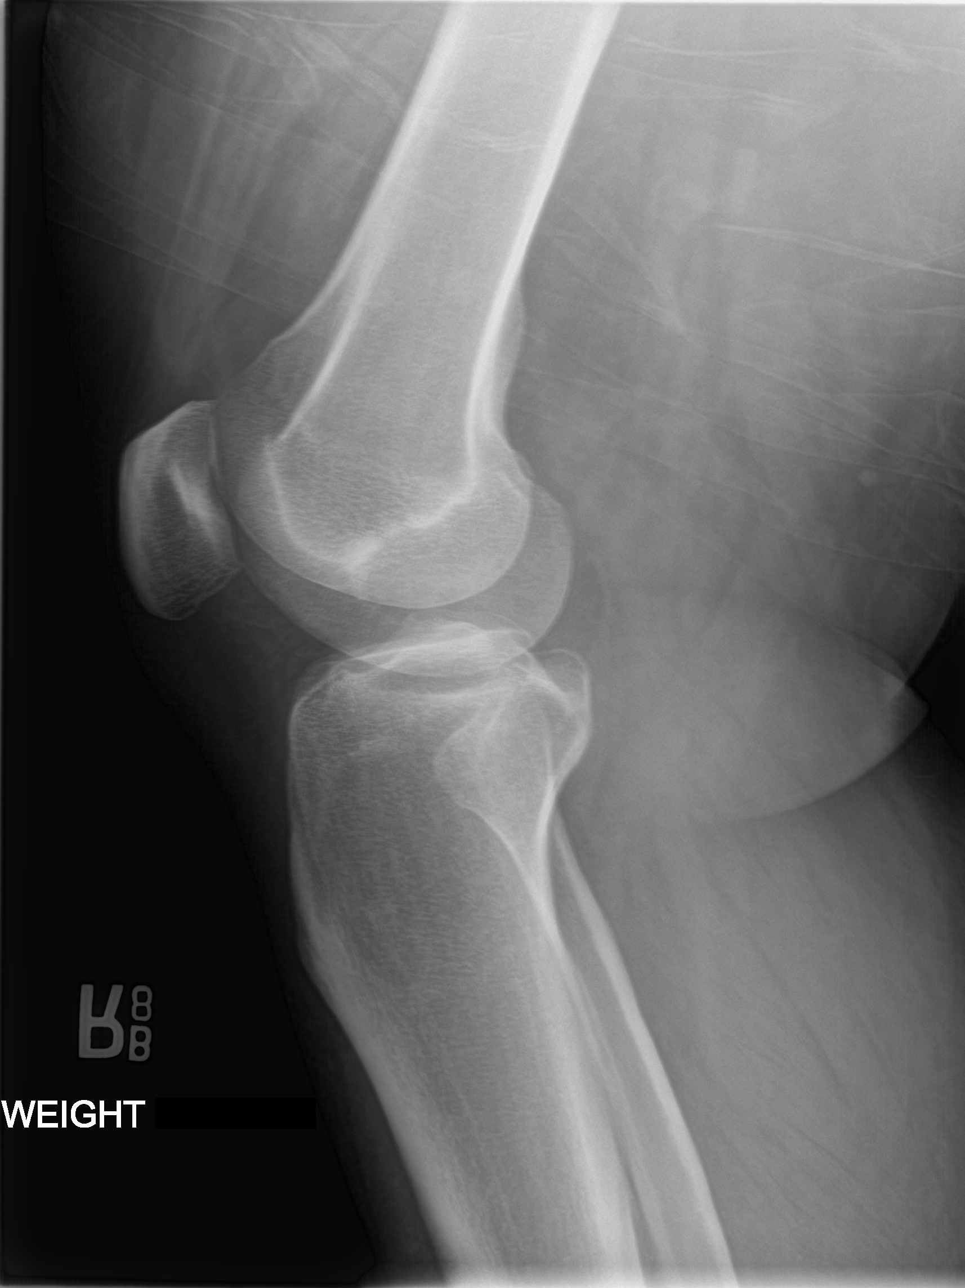

[sunrise]
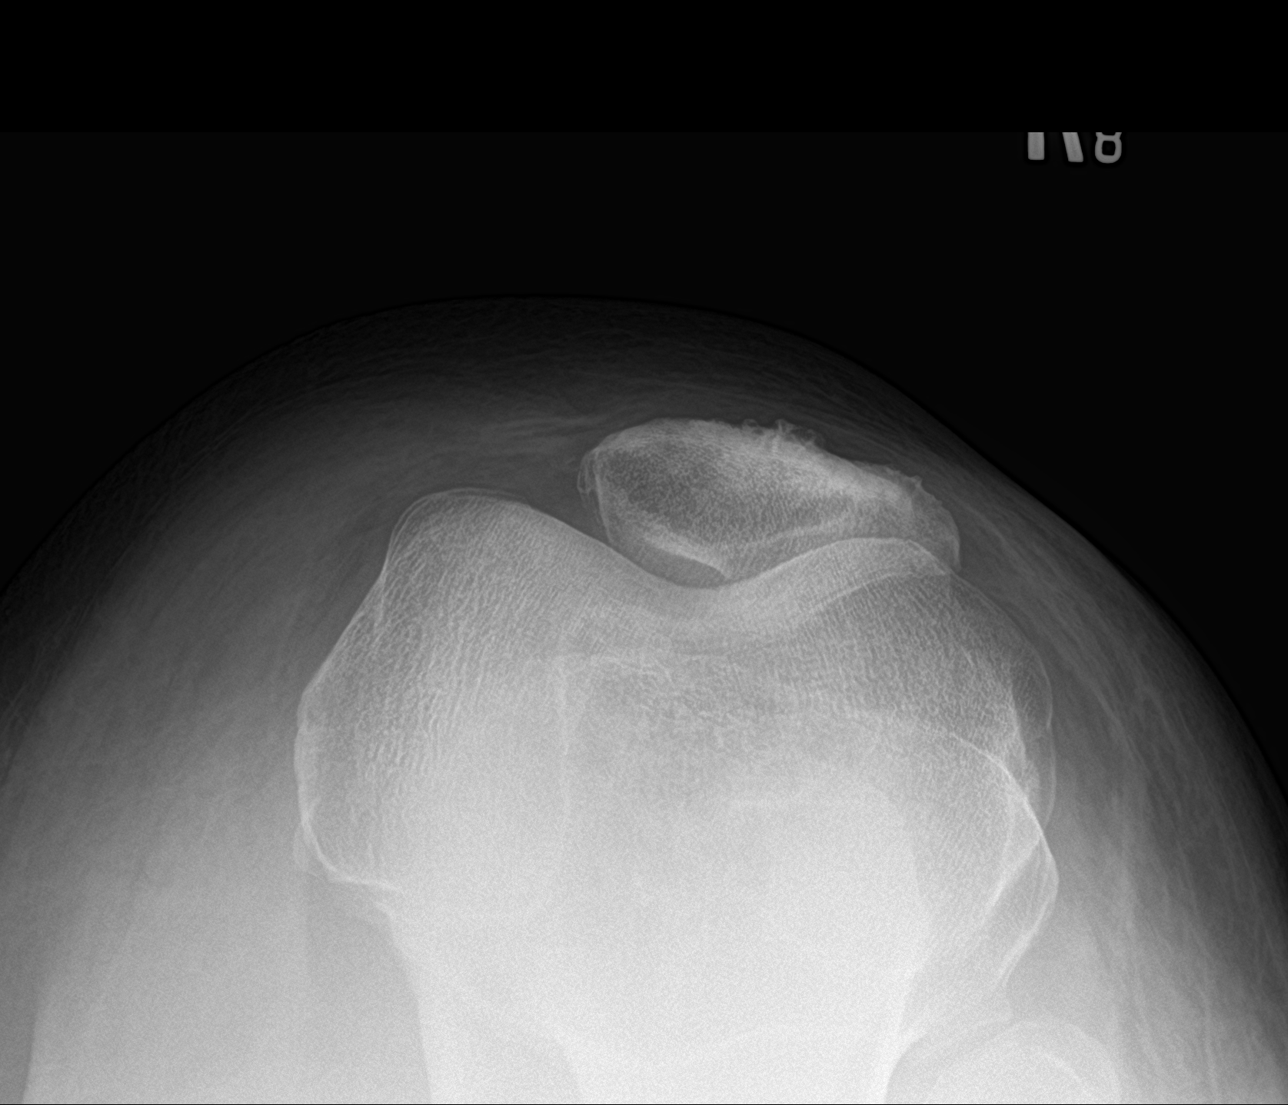

[4 of 4 positions shown; findings below may reference images not displayed]

FINDINGS: Mild to moderate medial joint space narrowing and minimal spur
formation. Minimal patellofemoral spur formation. No effusion seen.
IMPRESSION: Mild medial and minimal patellofemoral degenerative changes.

## 2019-03-01 ENCOUNTER — Other Ambulatory Visit: Payer: Self-pay | Admitting: Family

## 2019-03-02 NOTE — Telephone Encounter (Signed)
He was put back on Hyzaar-the combination pill. Request is for Losartan only. What is he taking? Can he do a virtual to follow up on his blood pressure? Was due in February.

## 2019-05-22 ENCOUNTER — Other Ambulatory Visit: Payer: Self-pay | Admitting: Family

## 2019-05-26 ENCOUNTER — Other Ambulatory Visit: Payer: Self-pay

## 2019-05-26 ENCOUNTER — Ambulatory Visit (INDEPENDENT_AMBULATORY_CARE_PROVIDER_SITE_OTHER): Payer: Self-pay | Admitting: Family

## 2019-05-26 ENCOUNTER — Encounter: Payer: Self-pay | Admitting: Family

## 2019-05-26 VITALS — BP 142/88 | HR 92 | Temp 98.1°F | Ht 73.0 in | Wt >= 6400 oz

## 2019-05-26 DIAGNOSIS — R600 Localized edema: Secondary | ICD-10-CM

## 2019-05-26 DIAGNOSIS — I10 Essential (primary) hypertension: Secondary | ICD-10-CM

## 2019-05-26 MED ORDER — POTASSIUM CHLORIDE CRYS ER 20 MEQ PO TBCR: 20.0000 meq | EXTENDED_RELEASE_TABLET | Freq: Every day | ORAL | 3 refills | Status: DC

## 2019-05-26 MED ORDER — FUROSEMIDE 20 MG PO TABS: ORAL_TABLET | ORAL | 1 refills | Status: DC

## 2019-05-26 NOTE — Progress Notes (Signed)
Kyle Marsh is a 38 y.o. male with the following history as recorded in EpicCare:  Patient Active Problem List   Diagnosis Date Noted  . Low testosterone in male 09/25/2018  . Sleep apnea 09/25/2018  . Benign hypertension 02/05/2017  . GERD 11/01/2009  . WEIGHT GAIN, ABNORMAL 11/01/2009    Current Outpatient Medications  Medication Sig Dispense Refill  . clomiPHENE (CLOMID) 50 MG tablet Take 25 mg by mouth daily.    . hydrochlorothiazide (HYDRODIURIL) 12.5 MG tablet TAKE 1 TABLET BY MOUTH EVERY DAY IN THE MORNING    . losartan (COZAAR) 100 MG tablet TAKE 1 TABLET BY MOUTH EVERY DAY ALONG WITH HCTZ 12.5MG  (COMBO TAB NOT AVAILABLE) 90 tablet 0  . Multiple Vitamin (MULTI VITAMIN MENS PO) Take by mouth.    . sildenafil (VIAGRA) 100 MG tablet TAKE 1 TABLET BY MOUTH EVERY DAY AS NEEDED FOR ERECTILE DYSFUNCTION 6 tablet 1  . furosemide (LASIX) 20 MG tablet Take 1 per day as directed; may take 2nd tablet in the pm as needed 60 tablet 1  . potassium chloride SA (K-DUR) 20 MEQ tablet Take 1 tablet (20 mEq total) by mouth daily. 30 tablet 3   No current facility-administered medications for this visit.     Allergies: Patient has no known allergies.  Past Medical History:  Diagnosis Date  . Diabetes mellitus without complication ([place])   . Hypertension     History reviewed. No pertinent surgical history.  Family History  Problem Relation Age of Onset  . Arthritis Mother   . Diabetes Mother   . High blood pressure Mother   . Arthritis Father   . High Cholesterol Father   . High blood pressure Father   . Diabetes Maternal Grandmother   . Kidney disease Maternal Grandmother   . Cancer Paternal Grandmother   . Diabetes Paternal Grandmother   . High blood pressure Paternal Grandmother   . Arthritis Paternal Grandfather   . Diabetes Paternal Grandfather     Social History   Tobacco Use  . Smoking status: Never Smoker  . Smokeless tobacco: Never Used  Substance Use Topics  . 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 Has been having increased swelling in both of his lower legs for the past few months; no wheezing, cough or chest pain or shortness of breath;      Objective:  Vitals:   05/26/19 1137  BP: (!) 142/88  Pulse: 92  Temp: 98.1 F (36.7 C)  TempSrc: Oral  SpO2: 96%  Weight: (!) 411 lb 12.8 oz (186.8 kg)  Height: 6\' 1"  (1.854 m)    General: Well developed, well nourished, in no acute distress  Skin : Warm and dry.  Head: Normocephalic and atraumatic  Lungs: Respirations unlabored; clear to auscultation bilaterally without wheeze, rales, rhonchi  CVS exam: normal rate and regular rhythm.  Extremities: Bilateral pitting 1+ edema, cyanosis, clubbing  Vessels: Symmetric bilaterally  Neurologic: Alert and oriented; speech intact; face symmetrical; moves all extremities well; CNII-XII intact without focal deficit   Assessment:  1. Benign hypertension   2. Pedal edema     Plan:  Continue Losartan 50 mg daily; d/c HCTZ- change to Lasix 20 mg- can take bid if needed; follow-up in 2 weeks.  Return in about 2  weeks (around 06/09/2019).  No orders of the defined types were placed in this encounter.   Requested Prescriptions   Signed Prescriptions Disp Refills  . furosemide (LASIX) 20 MG tablet 60 tablet 1    Sig: Take 1 per day as directed; may take 2nd tablet in the pm as needed  . potassium chloride SA (K-DUR) 20 MEQ tablet 30 tablet 3    Sig: Take 1 tablet (20 mEq total) by mouth daily.

## 2019-06-11 ENCOUNTER — Other Ambulatory Visit: Payer: Self-pay

## 2019-06-11 ENCOUNTER — Ambulatory Visit (INDEPENDENT_AMBULATORY_CARE_PROVIDER_SITE_OTHER): Payer: Self-pay | Admitting: Family

## 2019-06-11 ENCOUNTER — Encounter: Payer: Self-pay | Admitting: Family

## 2019-06-11 VITALS — BP 146/84 | HR 100 | Temp 98.1°F | Ht 73.0 in | Wt >= 6400 oz

## 2019-06-11 DIAGNOSIS — I10 Essential (primary) hypertension: Secondary | ICD-10-CM

## 2019-06-11 NOTE — Progress Notes (Signed)
Kyle Marsh is a 38 y.o. male with the following history as recorded in EpicCare:  Patient Active Problem List   Diagnosis Date Noted  . Low testosterone in male 09/25/2018  . Sleep apnea 09/25/2018  . Benign hypertension 02/05/2017  . GERD 11/01/2009  . WEIGHT GAIN, ABNORMAL 11/01/2009    Current Outpatient Medications  Medication Sig Dispense Refill  . clomiPHENE (CLOMID) 50 MG tablet Take 25 mg by mouth daily.    . furosemide (LASIX) 20 MG tablet Take 1 per day as directed; may take 2nd tablet in the pm as needed 60 tablet 1  . losartan (COZAAR) 100 MG tablet TAKE 1 TABLET BY MOUTH EVERY DAY ALONG WITH HCTZ 12.5MG  (COMBO TAB NOT AVAILABLE) 90 tablet 0  . Multiple Vitamin (MULTI VITAMIN MENS PO) Take by mouth.    . potassium chloride SA (K-DUR) 20 MEQ tablet Take 1 tablet (20 mEq total) by mouth daily. 30 tablet 3  . sildenafil (VIAGRA) 100 MG tablet TAKE 1 TABLET BY MOUTH EVERY DAY AS NEEDED FOR ERECTILE DYSFUNCTION 6 tablet 1   No current facility-administered medications for this visit.     Allergies: Patient has no known allergies.  Past Medical History:  Diagnosis Date  . Diabetes mellitus without complication ([place])   . Hypertension     No past surgical history on file.  Family History  Problem Relation Age of Onset  . Arthritis Mother   . Diabetes Mother   . High blood pressure Mother   . Arthritis Father   . High Cholesterol Father   . High blood pressure Father   . Diabetes Maternal Grandmother   . Kidney disease Maternal Grandmother   . Cancer Paternal Grandmother   . Diabetes Paternal Grandmother   . High blood pressure Paternal Grandmother   . Arthritis Paternal Grandfather   . Diabetes Paternal Grandfather     Social History   Tobacco Use  . Smoking status: Never Smoker  . Smokeless tobacco: Never Used  Substance Use Topics  . Alcohol use: No    Subjective:   2 week follow-up on hypertension; at last office visit, HCTZ was changed to Lasix 20 mg  daily; has lost 4 pounds since last office visit; Denies any chest pain, shortness of breath, blurred vision or headache. Has been feeling much better since changing to Lasix;     Objective:  Vitals:   06/11/19 1341  BP: (!) 146/84  Pulse: 100  Temp: 98.1 F (36.7 C)  TempSrc: Oral  SpO2: 95%  Weight: (!) 407 lb 3.2 oz (184.7 kg)  Height: 6\' 1"  (1.854 m)    General: Well developed, well nourished, in no acute distress  Skin : Warm and dry.  Head: Normocephalic and atraumatic  Lungs: Respirations unlabored; clear to auscultation bilaterally without wheeze, rales, rhonchi  CVS exam: normal rate and regular rhythm.  Extremities: bilateral pedal edema- not pitting, cyanosis, clubbing  Vessels: Symmetric bilaterally  Neurologic: Alert and oriented; speech intact; face symmetrical; moves all extremities well; CNII-XII intact without focal deficit   Assessment:  1. Essential hypertension     Plan:  Continue Losartan 100 mg daily; continue Lasix 20 mg bid prn; plan to check BMP at next OV; follow-up in 6 weeks, sooner prn. Will get flu shot at next office visit.   No follow-ups on file.  No orders of the defined types were placed in this encounter.   Requested Prescriptions    No prescriptions requested or ordered in this encounter

## 2019-06-14 ENCOUNTER — Ambulatory Visit (INDEPENDENT_AMBULATORY_CARE_PROVIDER_SITE_OTHER): Payer: Self-pay | Admitting: Family

## 2019-06-14 ENCOUNTER — Other Ambulatory Visit: Payer: Self-pay

## 2019-06-14 ENCOUNTER — Encounter: Payer: Self-pay | Admitting: Family

## 2019-06-14 VITALS — BP 152/90 | HR 111 | Temp 99.4°F | Ht 73.0 in | Wt >= 6400 oz

## 2019-06-14 DIAGNOSIS — R300 Dysuria: Secondary | ICD-10-CM

## 2019-06-14 MED ORDER — CIPROFLOXACIN HCL 500 MG PO TABS: 500.0000 mg | ORAL_TABLET | Freq: Two times a day (BID) | ORAL | 0 refills | Status: DC

## 2019-06-14 NOTE — Progress Notes (Signed)
Kyle Marsh is a 38 y.o. male with the following history as recorded in EpicCare:  Patient Active Problem List   Diagnosis Date Noted  . Low testosterone in male 09/25/2018  . Sleep apnea 09/25/2018  . Benign hypertension 02/05/2017  . GERD 11/01/2009  . WEIGHT GAIN, ABNORMAL 11/01/2009    Current Outpatient Medications  Medication Sig Dispense Refill  . clomiPHENE (CLOMID) 50 MG tablet Take 25 mg by mouth daily.    . furosemide (LASIX) 20 MG tablet Take 1 per day as directed; may take 2nd tablet in the pm as needed 60 tablet 1  . losartan (COZAAR) 100 MG tablet TAKE 1 TABLET BY MOUTH EVERY DAY ALONG WITH HCTZ 12.5MG  (COMBO TAB NOT AVAILABLE) 90 tablet 0  . Multiple Vitamin (MULTI VITAMIN MENS PO) Take by mouth.    . potassium chloride SA (K-DUR) 20 MEQ tablet Take 1 tablet (20 mEq total) by mouth daily. 30 tablet 3  . sildenafil (VIAGRA) 100 MG tablet TAKE 1 TABLET BY MOUTH EVERY DAY AS NEEDED FOR ERECTILE DYSFUNCTION 6 tablet 1  . ciprofloxacin (CIPRO) 500 MG tablet Take 1 tablet (500 mg total) by mouth 2 (two) times daily. 20 tablet 0   No current facility-administered medications for this visit.     Allergies: Patient has no known allergies.  Past Medical History:  Diagnosis Date  . Diabetes mellitus without complication ([place])   . Hypertension     History reviewed. No pertinent surgical history.  Family History  Problem Relation Age of Onset  . Arthritis Mother   . Diabetes Mother   . High blood pressure Mother   . Arthritis Father   . High Cholesterol Father   . High blood pressure Father   . Diabetes Maternal Grandmother   . Kidney disease Maternal Grandmother   . Cancer Paternal Grandmother   . Diabetes Paternal Grandmother   . High blood pressure Paternal Grandmother   . Arthritis Paternal Grandfather   . Diabetes Paternal Grandfather     Social History   Tobacco Use  . Smoking status: Never Smoker  . Smokeless tobacco: Never Used  Substance Use Topics  .  Alcohol use: No    Subjective:  Patient presents with concerns for possible UTI; complaining of burning with urination/ "light pink discharge." Symptoms started x 1 day;  Has been having some urinary hesitancy; no back pain, no kidney stones, no fever;  Is asking to limit labs/ tests as much as possible due to lack of insurance;   Objective:  Vitals:   06/14/19 1020  BP: (!) 152/90  Pulse: (!) 111  Temp: 99.4 F (37.4 C)  TempSrc: Oral  SpO2: 95%  Weight: (!) 406 lb 9.6 oz (184.4 kg)  Height: 6\' 1"  (1.854 m)    General: Well developed, well nourished, in no acute distress  Head: Normocephalic and atraumatic  Lungs: Respirations unlabored;  Neurologic: Alert and oriented; speech intact; face symmetrical; moves all extremities well; CNII-XII intact without focal deficit   Assessment:  1. Dysuria     Plan:  Suspect prostatitis vs UTI; patient defers doing labs today due to cost concerns/ lack of insurance; Rx for Cipro 500 mg bid x 10 days; increase water intake; follow-up worse, no better.   No follow-ups on file.  Orders Placed This Encounter  Procedures  . Urine Culture    Standing Status:   Future    Standing Expiration Date:   06/13/2020  . Urinalysis    Standing Status:  Future    Standing Expiration Date:   06/13/2020  . PSA    Standing Status:   Future    Standing Expiration Date:   06/13/2020  . GC Probe amplification, urine    Standing Status:   Future    Standing Expiration Date:   06/13/2020    Requested Prescriptions   Signed Prescriptions Disp Refills  . ciprofloxacin (CIPRO) 500 MG tablet 20 tablet 0    Sig: Take 1 tablet (500 mg total) by mouth 2 (two) times daily.

## 2019-06-14 NOTE — Patient Instructions (Signed)

## 2019-06-17 ENCOUNTER — Other Ambulatory Visit: Payer: Self-pay | Admitting: Family

## 2019-07-07 ENCOUNTER — Other Ambulatory Visit: Payer: Self-pay | Admitting: Family

## 2019-07-07 MED ORDER — FUROSEMIDE 20 MG PO TABS: ORAL_TABLET | ORAL | 1 refills | Status: DC

## 2019-07-09 ENCOUNTER — Encounter: Payer: Self-pay | Admitting: Family

## 2019-07-09 ENCOUNTER — Other Ambulatory Visit: Payer: Self-pay

## 2019-07-09 ENCOUNTER — Ambulatory Visit (INDEPENDENT_AMBULATORY_CARE_PROVIDER_SITE_OTHER): Payer: Self-pay | Admitting: Family

## 2019-07-09 VITALS — BP 148/84 | HR 92 | Temp 98.1°F | Ht 73.0 in | Wt >= 6400 oz

## 2019-07-09 DIAGNOSIS — I10 Essential (primary) hypertension: Secondary | ICD-10-CM

## 2019-07-09 MED ORDER — AMLODIPINE BESYLATE 5 MG PO TABS: 5.0000 mg | ORAL_TABLET | Freq: Every day | ORAL | 3 refills | Status: DC

## 2019-07-09 NOTE — Progress Notes (Signed)
Kyle Marsh is a 38 y.o. male with the following history as recorded in EpicCare:  Patient Active Problem List   Diagnosis Date Noted  . Low testosterone in male 09/25/2018  . Sleep apnea 09/25/2018  . Benign hypertension 02/05/2017  . GERD 11/01/2009  . WEIGHT GAIN, ABNORMAL 11/01/2009    Current Outpatient Medications  Medication Sig Dispense Refill  . clomiPHENE (CLOMID) 50 MG tablet Take 25 mg by mouth daily.    . furosemide (LASIX) 20 MG tablet 1 tablet daily; may take as 2nd dose in the pm as needed 180 tablet 1  . losartan (COZAAR) 100 MG tablet TAKE 1 TABLET BY MOUTH EVERY DAY ALONG WITH HCTZ 12.5MG  (COMBO TAB NOT AVAILABLE) 90 tablet 0  . Multiple Vitamin (MULTI VITAMIN MENS PO) Take by mouth.    . potassium chloride SA (K-DUR) 20 MEQ tablet Take 1 tablet (20 mEq total) by mouth daily. 30 tablet 3  . sildenafil (VIAGRA) 100 MG tablet TAKE 1 TABLET BY MOUTH EVERY DAY AS NEEDED FOR ERECTILE DYSFUNCTION 6 tablet 1  . amLODipine (NORVASC) 5 MG tablet Take 1 tablet (5 mg total) by mouth at bedtime. 30 tablet 3   No current facility-administered medications for this visit.     Allergies: Patient has no known allergies.  Past Medical History:  Diagnosis Date  . Diabetes mellitus without complication ([place])   . Hypertension     No past surgical history on file.  Family History  Problem Relation Age of Onset  . Arthritis Mother   . Diabetes Mother   . High blood pressure Mother   . Arthritis Father   . High Cholesterol Father   . High blood pressure Father   . Diabetes Maternal Grandmother   . Kidney disease Maternal Grandmother   . Cancer Paternal Grandmother   . Diabetes Paternal Grandmother   . High blood pressure Paternal Grandmother   . Arthritis Paternal Grandfather   . Diabetes Paternal Grandfather     Social History   Tobacco Use  . Smoking status: Never Smoker  . Smokeless tobacco: Never Used  Substance Use Topics  . Alcohol use: No    Subjective:   Follow-up on hypertension; currently taking Lasix 20 mg daily and Losartan 100 mg daily; is feeling better with addition of Lasix 20 mg daily; Denies any chest pain, shortness of breath, blurred vision or headache.    Objective:  Vitals:   07/09/19 1337  BP: (!) 148/84  Pulse: 92  Temp: 98.1 F (36.7 C)  TempSrc: Oral  SpO2: 99%  Weight: (!) 404 lb (183.3 kg)  Height: 6\' 1"  (1.854 m)    General: Well developed, well nourished, in no acute distress  Skin : Warm and dry.  Head: Normocephalic and atraumatic  Lungs: Respirations unlabored; clear to auscultation bilaterally without wheeze, rales, rhonchi  CVS exam: normal rate and regular rhythm.  Extremities: No edema, cyanosis, clubbing  Vessels: Symmetric bilaterally  Neurologic: Alert and oriented; speech intact; face symmetrical; moves all extremities well; CNII-XII intact without focal deficit   Assessment:  1. Essential hypertension     Plan:  Uncontrolled; add Amlodipine 5 mg at night; continue Losartan and Lasix; follow up in 1 month, sooner prn. Plan for flu shot and labs at next OV as patient should have health insurance back in place.   Return in about 5 weeks (around 08/13/2019).  No orders of the defined types were placed in this encounter.   Requested Prescriptions   Signed  Prescriptions Disp Refills  . amLODipine (NORVASC) 5 MG tablet 30 tablet 3    Sig: Take 1 tablet (5 mg total) by mouth at bedtime.

## 2019-08-06 ENCOUNTER — Ambulatory Visit: Payer: Self-pay | Admitting: Family

## 2019-08-16 ENCOUNTER — Other Ambulatory Visit: Payer: Self-pay | Admitting: Family

## 2019-10-07 ENCOUNTER — Other Ambulatory Visit: Payer: Self-pay | Admitting: Family

## 2019-11-26 ENCOUNTER — Ambulatory Visit: Payer: Commercial Managed Care - PPO | Admitting: Family

## 2019-11-26 DIAGNOSIS — Z0289 Encounter for other administrative examinations: Secondary | ICD-10-CM

## 2019-12-21 ENCOUNTER — Ambulatory Visit (INDEPENDENT_AMBULATORY_CARE_PROVIDER_SITE_OTHER): Payer: Commercial Managed Care - PPO | Admitting: Family

## 2019-12-21 DIAGNOSIS — I10 Essential (primary) hypertension: Secondary | ICD-10-CM

## 2019-12-21 MED ORDER — AMLODIPINE BESYLATE 5 MG PO TABS: 5.0000 mg | ORAL_TABLET | Freq: Every day | ORAL | 0 refills | Status: DC

## 2019-12-21 MED ORDER — FUROSEMIDE 20 MG PO TABS: ORAL_TABLET | ORAL | 0 refills | Status: DC

## 2019-12-21 MED ORDER — LOSARTAN POTASSIUM 100 MG PO TABS: 100.0000 mg | ORAL_TABLET | Freq: Every day | ORAL | 0 refills | Status: DC

## 2019-12-21 NOTE — Progress Notes (Signed)
Kyle Marsh is a 39 y.o. male with the following history as recorded in EpicCare:  Patient Active Problem List   Diagnosis Date Noted  . Low testosterone in male 09/25/2018  . Sleep apnea 09/25/2018  . Benign hypertension 02/05/2017  . GERD 11/01/2009  . WEIGHT GAIN, ABNORMAL 11/01/2009    Current Outpatient Medications  Medication Sig Dispense Refill  . amLODipine (NORVASC) 5 MG tablet Take 1 tablet (5 mg total) by mouth daily. 90 tablet 0  . furosemide (LASIX) 20 MG tablet 1 tablet daily; may take as 2nd dose in the pm as needed 180 tablet 0  . Multiple Vitamin (MULTI VITAMIN MENS PO) Take by mouth.    . clomiPHENE (CLOMID) 50 MG tablet Take 25 mg by mouth daily.    Marland Kitchen KLOR-CON M20 20 MEQ tablet TAKE 1 TABLET BY MOUTH EVERY DAY 90 tablet 1  . losartan (COZAAR) 100 MG tablet Take 1 tablet (100 mg total) by mouth daily. (Patient not taking: Reported on 12/21/2019) 90 tablet 0  . sildenafil (VIAGRA) 100 MG tablet TAKE 1 TABLET BY MOUTH EVERY DAY AS NEEDED FOR ERECTILE DYSFUNCTION (Patient not taking: Reported on 12/21/2019) 6 tablet 1   No current facility-administered medications for this visit.    Allergies: Patient has no known allergies.  Past Medical History:  Diagnosis Date  . Diabetes mellitus without complication ([place])   . Hypertension     No past surgical history on file.  Family History  Problem Relation Age of Onset  . Arthritis Mother   . Diabetes Mother   . High blood pressure Mother   . Arthritis Father   . High Cholesterol Father   . High blood pressure Father   . Diabetes Maternal Grandmother   . Kidney disease Maternal Grandmother   . Cancer Paternal Grandmother   . Diabetes Paternal Grandmother   . High blood pressure Paternal Grandmother   . Arthritis Paternal Grandfather   . Diabetes Paternal Grandfather     Social History   Tobacco Use  . Smoking status: Never Smoker  . Smokeless tobacco: Never Used  Substance Use Topics  . Alcohol use: No     Subjective:   I connected with Kyle Marsh on 12/21/19 at 12:20 PM EST by a telephone call and verified that I am speaking with the correct person using two identifiers.   I discussed the limitations of evaluation and management by telemedicine and the availability of in person appointments. The patient expressed understanding and agreed to proceed.  Patient has missed multiple follow-up appointments in the past few months; notes he is out of blood pressure medication- specifically Losartan; asking for refills until he can be seen in the office;    Objective:  There were no vitals filed for this visit.  Lungs: Respirations unlabored; clear to auscultation bilaterally without wheeze, rales, rhonchi  Neurologic: Alert and oriented; speech intact; face symmetrical; moves all extremities well; CNII-XII intact without focal deficit   Assessment:  1. Essential hypertension     Plan:  Overdue for in office visit; refills updated; plan to be seen in office and update labs at next appointment.  Time spent 12 minutes  No follow-ups on file.  No orders of the defined types were placed in this encounter.   Requested Prescriptions   Signed Prescriptions Disp Refills  . amLODipine (NORVASC) 5 MG tablet 90 tablet 0    Sig: Take 1 tablet (5 mg total) by mouth daily.  . furosemide (LASIX) 20  MG tablet 180 tablet 0    Sig: 1 tablet daily; may take as 2nd dose in the pm as needed  . losartan (COZAAR) 100 MG tablet 90 tablet 0    Sig: Take 1 tablet (100 mg total) by mouth daily.    Patient not taking: Reported on 12/21/2019

## 2020-01-04 ENCOUNTER — Other Ambulatory Visit: Payer: Self-pay

## 2020-01-04 ENCOUNTER — Ambulatory Visit (INDEPENDENT_AMBULATORY_CARE_PROVIDER_SITE_OTHER): Payer: Commercial Managed Care - PPO | Admitting: Family

## 2020-01-04 ENCOUNTER — Encounter: Payer: Self-pay | Admitting: Family

## 2020-01-04 VITALS — BP 142/90 | HR 80 | Temp 98.0°F | Ht 73.0 in | Wt >= 6400 oz

## 2020-01-04 DIAGNOSIS — Z13220 Encounter for screening for lipoid disorders: Secondary | ICD-10-CM | POA: Diagnosis not present

## 2020-01-04 DIAGNOSIS — R7309 Other abnormal glucose: Secondary | ICD-10-CM | POA: Diagnosis not present

## 2020-01-04 DIAGNOSIS — R350 Frequency of micturition: Secondary | ICD-10-CM

## 2020-01-04 DIAGNOSIS — R200 Anesthesia of skin: Secondary | ICD-10-CM | POA: Diagnosis not present

## 2020-01-04 DIAGNOSIS — R202 Paresthesia of skin: Secondary | ICD-10-CM

## 2020-01-04 DIAGNOSIS — I10 Essential (primary) hypertension: Secondary | ICD-10-CM | POA: Diagnosis not present

## 2020-01-04 LAB — CBC WITH DIFFERENTIAL/PLATELET
Basophils Absolute: 0 10*3/uL (ref 0.0–0.1)
Basophils Relative: 0.8 % (ref 0.0–3.0)
Eosinophils Absolute: 0 10*3/uL (ref 0.0–0.7)
Eosinophils Relative: 0.6 % (ref 0.0–5.0)
HCT: 45.5 % (ref 39.0–52.0)
Hemoglobin: 14.7 g/dL (ref 13.0–17.0)
Lymphocytes Relative: 27.2 % (ref 12.0–46.0)
Lymphs Abs: 1.6 10*3/uL (ref 0.7–4.0)
MCHC: 32.3 g/dL (ref 30.0–36.0)
MCV: 88.7 fl (ref 78.0–100.0)
Monocytes Absolute: 0.4 10*3/uL (ref 0.1–1.0)
Monocytes Relative: 7.6 % (ref 3.0–12.0)
Neutro Abs: 3.7 10*3/uL (ref 1.4–7.7)
Neutrophils Relative %: 63.8 % (ref 43.0–77.0)
Platelets: 168 10*3/uL (ref 150.0–400.0)
RBC: 5.13 Mil/uL (ref 4.22–5.81)
RDW: 14.5 % (ref 11.5–15.5)
WBC: 5.8 10*3/uL (ref 4.0–10.5)

## 2020-01-04 LAB — COMPREHENSIVE METABOLIC PANEL
ALT: 60 U/L — ABNORMAL HIGH (ref 0–53)
AST: 38 U/L — ABNORMAL HIGH (ref 0–37)
Albumin: 4.1 g/dL (ref 3.5–5.2)
Alkaline Phosphatase: 76 U/L (ref 39–117)
BUN: 12 mg/dL (ref 6–23)
CO2: 36 mEq/L — ABNORMAL HIGH (ref 19–32)
Calcium: 9.6 mg/dL (ref 8.4–10.5)
Chloride: 92 mEq/L — ABNORMAL LOW (ref 96–112)
Creatinine, Ser: 1.03 mg/dL (ref 0.40–1.50)
GFR: 97.76 mL/min (ref >60.00)
Glucose, Bld: 345 mg/dL — ABNORMAL HIGH (ref 70–99)
Potassium: 4.4 mEq/L (ref 3.5–5.1)
Sodium: 134 mEq/L — ABNORMAL LOW (ref 135–145)
Total Bilirubin: 0.5 mg/dL (ref 0.2–1.2)
Total Protein: 8.1 g/dL (ref 6.0–8.3)

## 2020-01-04 LAB — LIPID PANEL
Cholesterol: 199 mg/dL (ref 0–200)
HDL: 44.8 mg/dL (ref >39.00)
LDL Cholesterol: 122 mg/dL — ABNORMAL HIGH (ref 0–99)
NonHDL: 154.1
Total CHOL/HDL Ratio: 4
Triglycerides: 162 mg/dL — ABNORMAL HIGH (ref 0.0–149.0)
VLDL: 32.4 mg/dL (ref 0.0–40.0)

## 2020-01-04 LAB — PSA: PSA: 0.13 ng/mL (ref 0.10–4.00)

## 2020-01-04 LAB — VITAMIN B12: Vitamin B-12: 499 pg/mL (ref 211–911)

## 2020-01-04 LAB — HEMOGLOBIN A1C: Hgb A1c MFr Bld: 12.2 % — ABNORMAL HIGH (ref 4.6–6.5)

## 2020-01-04 LAB — TSH: TSH: 1.48 u[IU]/mL (ref 0.35–4.50)

## 2020-01-04 MED ORDER — AMLODIPINE BESYLATE 10 MG PO TABS: 10.0000 mg | ORAL_TABLET | Freq: Every day | ORAL | 1 refills | Status: DC

## 2020-01-04 NOTE — Progress Notes (Signed)
Kyle Marsh is a 39 y.o. male with the following history as recorded in EpicCare:  Patient Active Problem List   Diagnosis Date Noted  . Low testosterone in male 09/25/2018  . Sleep apnea 09/25/2018  . Benign hypertension 02/05/2017  . GERD 11/01/2009  . WEIGHT GAIN, ABNORMAL 11/01/2009    Current Outpatient Medications  Medication Sig Dispense Refill  . amLODipine (NORVASC) 10 MG tablet Take 1 tablet (10 mg total) by mouth daily. 90 tablet 1  . furosemide (LASIX) 20 MG tablet 1 tablet daily; may take as 2nd dose in the pm as needed 180 tablet 0  . losartan (COZAAR) 100 MG tablet Take 1 tablet (100 mg total) by mouth daily. 90 tablet 0  . Multiple Vitamin (MULTI VITAMIN MENS PO) Take by mouth.    . sildenafil (VIAGRA) 100 MG tablet TAKE 1 TABLET BY MOUTH EVERY DAY AS NEEDED FOR ERECTILE DYSFUNCTION 6 tablet 1  . clomiPHENE (CLOMID) 50 MG tablet Take 25 mg by mouth daily.    Marland Kitchen KLOR-CON M20 20 MEQ tablet TAKE 1 TABLET BY MOUTH EVERY DAY (Patient not taking: Reported on 01/04/2020) 90 tablet 1   No current facility-administered medications for this visit.    Allergies: Patient has no known allergies.  Past Medical History:  Diagnosis Date  . Diabetes mellitus without complication ([place])   . Hypertension     History reviewed. No pertinent surgical history.  Family History  Problem Relation Age of Onset  . Arthritis Mother   . Diabetes Mother   . High blood pressure Mother   . Arthritis Father   . High Cholesterol Father   . High blood pressure Father   . Diabetes Maternal Grandmother   . Kidney disease Maternal Grandmother   . Cancer Paternal Grandmother   . Diabetes Paternal Grandmother   . High blood pressure Paternal Grandmother   . Arthritis Paternal Grandfather   . Diabetes Paternal Grandfather     Social History   Tobacco Use  . Smoking status: Never Smoker  . Smokeless tobacco: Never Used  Substance Use Topics  . Alcohol use: No    Subjective:  Follow-up on  hypertension; per patient, has been having some increased numbness in left leg; having increased urination at night/ does occasionally wet the bed- takes his Lasix at 6 am; takes Losartan in the am and Amlodipine in the pm;  Does have occasional sensation of numbness in left leg; Admits that has been having increased thirst for 2-3 months; history of pre-diabetes- last Hgba1c checked in 2019 was at 6.1;     Objective:  Vitals:   01/04/20 1017  BP: (!) 142/90  Pulse: 80  Temp: 98 F (36.7 C)  TempSrc: Oral  SpO2: 96%  Weight: (!) 403 lb 9.6 oz (183.1 kg)  Height: _0  (1.854 m)    General: Well developed, well nourished, in no acute distress  Skin : Warm and dry.  Head: Normocephalic and atraumatic  Oropharynx: Pink, supple. No suspicious lesions  Neck: Supple without thyromegaly, adenopathy  Lungs: Respirations unlabored; clear to auscultation bilaterally without wheeze, rales, rhonchi  CVS exam: normal rate and regular rhythm.  Musculoskeletal: No deformities; no active joint inflammation  Extremities: bilateral pedal edema, no cyanosis, no clubbing  Vessels: Symmetric bilaterally  Neurologic: Alert and oriented; speech intact; face symmetrical; moves all extremities well; CNII-XII intact without focal deficit   Assessment:  1. Essential hypertension   2. Lipid screening   3. Elevated glucose   4.  Numbness and tingling   5. Urinary frequency     Plan:  1. Uncontrolled; increase Amlodipine to 10 mg daily; suspect mild swelling in his legs is actually weight related- not medication related as patient has had this type of issue without Amlodipine; 2. Check lipid panel; 3. Check Hgba1c; suspect patient is now diabetic; 4. Check B12 and TSH today; 5. Check urine culture, PSA today; follow up to be determined.   This visit occurred during the SARS-CoV-2 public health emergency.  Safety protocols were in place, including screening questions prior to the visit, additional usage  of staff PPE, and extensive cleaning of exam room while observing appropriate contact time as indicated for disinfecting solutions.      No follow-ups on file.  Orders Placed This Encounter  Procedures  . Urine Culture  . CBC with Differential/Platelet  . Comp Met (CMET)  . Lipid panel  . HgB A1c  . B12  . TSH  . PSA    Requested Prescriptions   Signed Prescriptions Disp Refills  . amLODipine (NORVASC) 10 MG tablet 90 tablet 1    Sig: Take 1 tablet (10 mg total) by mouth daily.

## 2020-01-05 ENCOUNTER — Other Ambulatory Visit: Payer: Self-pay | Admitting: Family

## 2020-01-05 DIAGNOSIS — E119 Type 2 diabetes mellitus without complications: Secondary | ICD-10-CM

## 2020-01-05 LAB — URINE CULTURE

## 2020-01-05 MED ORDER — METFORMIN HCL ER 500 MG PO TB24: 500.0000 mg | ORAL_TABLET | Freq: Two times a day (BID) | ORAL | 1 refills | Status: DC

## 2020-01-05 MED ORDER — TRULICITY 0.75 MG/0.5ML ~~LOC~~ SOAJ: 0.7500 mg | SUBCUTANEOUS | 1 refills | Status: DC

## 2020-01-06 ENCOUNTER — Telehealth: Payer: Self-pay | Admitting: Family

## 2020-01-06 NOTE — Telephone Encounter (Signed)
Dulaglutide (TRULICITY) 0.75 MG/0.5ML SOPN   Patient states this medication cost to much. He would like to know if something cheaper can be called in.

## 2020-01-07 NOTE — Telephone Encounter (Signed)
Spoke with patient and advised him to check with his insurance company.

## 2020-01-07 NOTE — Telephone Encounter (Signed)
Was he given alternatives that his insurance would cover?

## 2020-01-14 ENCOUNTER — Telehealth: Payer: Self-pay | Admitting: Family

## 2020-01-14 NOTE — Telephone Encounter (Signed)
    Patient requesting order for glucometer , strips, lancets.

## 2020-01-17 ENCOUNTER — Other Ambulatory Visit: Payer: Self-pay | Admitting: Family

## 2020-01-17 MED ORDER — BLOOD GLUCOSE MONITOR KIT: PACK | 0 refills | Status: DC

## 2020-01-27 ENCOUNTER — Other Ambulatory Visit: Payer: Self-pay | Admitting: Family

## 2020-01-28 NOTE — Telephone Encounter (Signed)
Called and left message for patient to return call to clinic today.

## 2020-01-28 NOTE — Telephone Encounter (Signed)
Please call and see how his blood sugars are doing; I want to go ahead and increase the Metformin to 2 in the and 2 in the pm if he is tolerating okay.

## 2020-01-31 ENCOUNTER — Telehealth: Payer: Self-pay | Admitting: Family

## 2020-01-31 MED ORDER — METFORMIN HCL ER 500 MG PO TB24: 500.0000 mg | ORAL_TABLET | Freq: Two times a day (BID) | ORAL | 1 refills | Status: DC

## 2020-01-31 NOTE — Telephone Encounter (Signed)
Spoke with patient and info given 

## 2020-01-31 NOTE — Telephone Encounter (Signed)
New message:   Pt states he was told to call and give his blood sugar readings. He states it has ranged from 134-146 sugar reading since starting taking it on 04/10/2. Pt states he is feeling good.Please advise.

## 2020-01-31 NOTE — Telephone Encounter (Signed)
Please see below.

## 2020-01-31 NOTE — Telephone Encounter (Signed)
Okay- with blood sugars like that, I am going to leave his Metformin at one in the am and one in the pm; keep planned follow up for later this month.

## 2020-02-03 ENCOUNTER — Ambulatory Visit: Payer: Commercial Managed Care - PPO | Attending: Family

## 2020-02-03 DIAGNOSIS — Z23 Encounter for immunization: Secondary | ICD-10-CM

## 2020-02-03 NOTE — Progress Notes (Signed)
   Covid-19 Vaccination Clinic  Name:  Kyle Marsh    MRN: [id number] DOB: January 05, 1981  02/03/2020  Kyle Marsh was observed post Covid-19 immunization for 15 minutes without incident. He was provided with Vaccine Information Sheet and instruction to access the V-Safe system.   Kyle Marsh was instructed to call 911 with any severe reactions post vaccine: Marland Kitchen Difficulty breathing  . Swelling of face and throat  . A fast heartbeat  . A bad rash all over body  . Dizziness and weakness   Immunizations Administered    Name Date Dose VIS Date Route   Moderna COVID-19 Vaccine 02/03/2020  4:26 PM 0.5 mL 09/21/2019 Intramuscular   Manufacturer: Moderna   Lot: [id number]   NDC: 17510-258-52

## 2020-02-03 NOTE — Progress Notes (Signed)
   Covid-19 Vaccination Clinic  Name:  Kyle Marsh    MRN: [id number] DOB: Nov 25, 1980  02/03/2020  [name] was observed post Covid-19 immunization for 30 minutes based on pre-vaccination screening without incident. He was provided with Vaccine Information Sheet and instruction to access the V-Safe system.   [name] was instructed to call 911 with any severe reactions post vaccine: Marland Kitchen Difficulty breathing  . Swelling of face and throat  . A fast heartbeat  . A bad rash all over body  . Dizziness and weakness   Immunizations Administered    Name Date Dose VIS Date Route   Moderna COVID-19 Vaccine 02/03/2020  4:26 PM 0.5 mL 09/21/2019 Intramuscular   Manufacturer: Moderna   Lot: [id number]   NDC: 51898-421-03

## 2020-02-09 ENCOUNTER — Encounter: Payer: Self-pay | Admitting: Family

## 2020-02-09 ENCOUNTER — Other Ambulatory Visit: Payer: Self-pay

## 2020-02-09 ENCOUNTER — Ambulatory Visit (INDEPENDENT_AMBULATORY_CARE_PROVIDER_SITE_OTHER): Payer: Commercial Managed Care - PPO | Admitting: Family

## 2020-02-09 VITALS — BP 142/90 | HR 90 | Temp 98.6°F | Wt >= 6400 oz

## 2020-02-09 DIAGNOSIS — E119 Type 2 diabetes mellitus without complications: Secondary | ICD-10-CM

## 2020-02-09 LAB — COMPREHENSIVE METABOLIC PANEL
ALT: 34 U/L (ref 0–53)
AST: 23 U/L (ref 0–37)
Albumin: 4.3 g/dL (ref 3.5–5.2)
Alkaline Phosphatase: 61 U/L (ref 39–117)
BUN: 10 mg/dL (ref 6–23)
CO2: 35 mEq/L — ABNORMAL HIGH (ref 19–32)
Calcium: 9.2 mg/dL (ref 8.4–10.5)
Chloride: 98 mEq/L (ref 96–112)
Creatinine, Ser: 0.93 mg/dL (ref 0.40–1.50)
GFR: 109.94 mL/min (ref >60.00)
Glucose, Bld: 105 mg/dL — ABNORMAL HIGH (ref 70–99)
Potassium: 4.1 mEq/L (ref 3.5–5.1)
Sodium: 138 mEq/L (ref 135–145)
Total Bilirubin: 0.4 mg/dL (ref 0.2–1.2)
Total Protein: 7.9 g/dL (ref 6.0–8.3)

## 2020-02-09 MED ORDER — METFORMIN HCL 1000 MG PO TABS: 1000.0000 mg | ORAL_TABLET | Freq: Two times a day (BID) | ORAL | 3 refills | Status: DC

## 2020-02-09 NOTE — Progress Notes (Signed)
Kyle Marsh is a 39 y.o. male with the following history as recorded in EpicCare:  Patient Active Problem List   Diagnosis Date Noted  . Low testosterone in male 09/25/2018  . Sleep apnea 09/25/2018  . Benign hypertension 02/05/2017  . GERD 11/01/2009  . WEIGHT GAIN, ABNORMAL 11/01/2009    Current Outpatient Medications  Medication Sig Dispense Refill  . amLODipine (NORVASC) 10 MG tablet Take 1 tablet (10 mg total) by mouth daily. 90 tablet 1  . blood glucose meter kit and supplies KIT Dispense based on patient and insurance preference. Use up to four times daily as directed. (FOR ICD-9 250.00, 250.01). 1 each 0  . Dulaglutide (TRULICITY) 6.65 LD/3.5TS SOPN Inject 0.75 mg into the skin once a week. 4 pen 1  . furosemide (LASIX) 20 MG tablet 1 tablet daily; may take as 2nd dose in the pm as needed 180 tablet 0  . KLOR-CON M20 20 MEQ tablet TAKE 1 TABLET BY MOUTH EVERY DAY 90 tablet 1  . losartan (COZAAR) 100 MG tablet Take 1 tablet (100 mg total) by mouth daily. 90 tablet 0  . Multiple Vitamin (MULTI VITAMIN MENS PO) Take by mouth.    . sildenafil (VIAGRA) 100 MG tablet TAKE 1 TABLET BY MOUTH EVERY DAY AS NEEDED FOR ERECTILE DYSFUNCTION 6 tablet 1  . clomiPHENE (CLOMID) 50 MG tablet Take 25 mg by mouth daily.    . metFORMIN (GLUCOPHAGE) 1000 MG tablet Take 1 tablet (1,000 mg total) by mouth 2 (two) times daily with a meal. 180 tablet 3   No current facility-administered medications for this visit.    Allergies: Patient has no known allergies.  Past Medical History:  Diagnosis Date  . Diabetes mellitus without complication ([place])   . Hypertension     No past surgical history on file.  Family History  Problem Relation Age of Onset  . Arthritis Mother   . Diabetes Mother   . High blood pressure Mother   . Arthritis Father   . High Cholesterol Father   . High blood pressure Father   . Diabetes Maternal Grandmother   . Kidney disease Maternal Grandmother   . Cancer Paternal  Grandmother   . Diabetes Paternal Grandmother   . High blood pressure Paternal Grandmother   . Arthritis Paternal Grandfather   . Diabetes Paternal Grandfather     Social History   Tobacco Use  . Smoking status: Never Smoker  . Smokeless tobacco: Never Used  Substance Use Topics  . Alcohol use: No    Subjective:  Follow-up on recent diagnosis of Type 2 Diabetes; doing well on combination of Trulicity and Metformin but notes that cost of Trulicity is prohibitive; Is checking his sugar regularly- last reading he has is 158 and that was 2 hours after eating; no concerns for low sugar; knows he is drinking too many sugary drinks; Trying to exercise more; Denies any chest pain, shortness of breath, blurred vision or headache   Objective:  Vitals:   02/09/20 1129  BP: (!) 142/90  Pulse: 90  Temp: 98.6 F (37 C)  TempSrc: Oral  SpO2: 95%  Weight: (!) 402 lb 3.2 oz (182.4 kg)    General: Well developed, well nourished, in no acute distress  Skin : Warm and dry.  Head: Normocephalic and atraumatic  Lungs: Respirations unlabored; clear to auscultation bilaterally without wheeze, rales, rhonchi  CVS exam: normal rate and regular rhythm.  Neurologic: Alert and oriented; speech intact; face symmetrical; moves all extremities  well; CNII-XII intact without focal deficit  Assessment:  1. Type 2 diabetes mellitus without complication, without long-term current use of insulin ([facility])     Plan:  Increase Metformin to 1000 mg bid; he will check on cost of Trulicity with savings card- if he can get for $25/ month, he will continue; otherwise, other medication options needs to be discussed; Follow up in 2 months;  Return in about 2 months (around 04/10/2020).  Orders Placed This Encounter  Procedures  . CBC with Differential/Platelet  . Comp Met (CMET)    Requested Prescriptions   Signed Prescriptions Disp Refills  . metFORMIN (GLUCOPHAGE) 1000 MG tablet 180 tablet 3    Sig: Take 1  tablet (1,000 mg total) by mouth 2 (two) times daily with a meal.

## 2020-02-10 ENCOUNTER — Other Ambulatory Visit: Payer: Self-pay | Admitting: Family

## 2020-02-10 LAB — CBC WITH DIFFERENTIAL/PLATELET
Basophils Absolute: 0 10*3/uL (ref 0.0–0.1)
Basophils Relative: 0.8 % (ref 0.0–3.0)
Eosinophils Absolute: 0.1 10*3/uL (ref 0.0–0.7)
Eosinophils Relative: 1.2 % (ref 0.0–5.0)
HCT: 40.8 % (ref 39.0–52.0)
Hemoglobin: 13.1 g/dL (ref 13.0–17.0)
Lymphocytes Relative: 29.3 % (ref 12.0–46.0)
Lymphs Abs: 1.6 10*3/uL (ref 0.7–4.0)
MCHC: 32.1 g/dL (ref 30.0–36.0)
MCV: 91.8 fl (ref 78.0–100.0)
Monocytes Absolute: 0.4 10*3/uL (ref 0.1–1.0)
Monocytes Relative: 8 % (ref 3.0–12.0)
Neutro Abs: 3.3 10*3/uL (ref 1.4–7.7)
Neutrophils Relative %: 60.7 % (ref 43.0–77.0)
Platelets: 218 10*3/uL (ref 150.0–400.0)
RBC: 4.45 Mil/uL (ref 4.22–5.81)
RDW: 15 % (ref 11.5–15.5)
WBC: 5.5 10*3/uL (ref 4.0–10.5)

## 2020-02-26 ENCOUNTER — Other Ambulatory Visit: Payer: Self-pay | Admitting: Family

## 2020-02-28 ENCOUNTER — Telehealth: Payer: Self-pay | Admitting: Family

## 2020-02-28 NOTE — Telephone Encounter (Signed)
He was told after his last labs to just take the Metformin 1000 mg bid and hold the Trulicity. It doesn't look like he read the MyChart message with those directions? What has he been taking since his last office visit?

## 2020-02-28 NOTE — Telephone Encounter (Signed)
Called and left message for patient to return call to clinic today to discuss.

## 2020-02-28 NOTE — Telephone Encounter (Signed)
New message:   Pt's wife is calling and states that the pt was told by the pharmacy they need prior authorization for Dulaglutide (TRULICITY) 0.75 MG/0.5ML SOPN. Pt states it needs to go to [id number] - [place], [place] - [address]Marland Kitchen Please advise.

## 2020-03-02 NOTE — Telephone Encounter (Signed)
Left message again for patient today to return call to clinic to discuss.

## 2020-03-02 NOTE — Telephone Encounter (Signed)
Spoke with patient today. He has been taking the Metformin 1000 BID. He will use the coupon info he was given to see if he can use it for the Trulicity. He will call us back with any issues.

## 2020-03-07 ENCOUNTER — Ambulatory Visit: Payer: Commercial Managed Care - PPO

## 2020-03-07 ENCOUNTER — Ambulatory Visit: Payer: Commercial Managed Care - PPO | Attending: Family

## 2020-03-07 DIAGNOSIS — Z23 Encounter for immunization: Secondary | ICD-10-CM

## 2020-03-07 NOTE — Progress Notes (Signed)
   Covid-19 Vaccination Clinic  Name:  Kyle Marsh    MRN: [id number] DOB: [birth year]  03/07/2020  [name] was observed post Covid-19 immunization for 15 minutes without incident. He was provided with Vaccine Information Sheet and instruction to access the V-Safe system.   [name] was instructed to call 911 with any severe reactions post vaccine: Marland Kitchen Difficulty breathing  . Swelling of face and throat  . A fast heartbeat  . A bad rash all over body  . Dizziness and weakness   Immunizations Administered    Name Date Dose VIS Date Route   Moderna COVID-19 Vaccine 03/07/2020  4:10 PM 0.5 mL 09/2019 Intramuscular   Manufacturer: Moderna   Lot: [id number]   NDC: 82707-867-54

## 2020-03-22 ENCOUNTER — Encounter: Payer: Self-pay | Admitting: Family

## 2020-03-22 ENCOUNTER — Other Ambulatory Visit: Payer: Self-pay

## 2020-03-22 ENCOUNTER — Ambulatory Visit (INDEPENDENT_AMBULATORY_CARE_PROVIDER_SITE_OTHER): Payer: Commercial Managed Care - PPO | Admitting: Family

## 2020-03-22 VITALS — BP 140/90 | HR 96 | Temp 98.4°F | Ht 73.0 in | Wt >= 6400 oz

## 2020-03-22 DIAGNOSIS — W57XXXA Bitten or stung by nonvenomous insect and other nonvenomous arthropods, initial encounter: Secondary | ICD-10-CM | POA: Diagnosis not present

## 2020-03-22 DIAGNOSIS — S80861A Insect bite (nonvenomous), right lower leg, initial encounter: Secondary | ICD-10-CM | POA: Diagnosis not present

## 2020-03-22 DIAGNOSIS — E119 Type 2 diabetes mellitus without complications: Secondary | ICD-10-CM | POA: Diagnosis not present

## 2020-03-22 LAB — COMPREHENSIVE METABOLIC PANEL
ALT: 41 U/L (ref 0–53)
AST: 24 U/L (ref 0–37)
Albumin: 4.3 g/dL (ref 3.5–5.2)
Alkaline Phosphatase: 57 U/L (ref 39–117)
BUN: 12 mg/dL (ref 6–23)
CO2: 37 mEq/L — ABNORMAL HIGH (ref 19–32)
Calcium: 9.5 mg/dL (ref 8.4–10.5)
Chloride: 96 mEq/L (ref 96–112)
Creatinine, Ser: 0.95 mg/dL (ref 0.40–1.50)
GFR: 107.2 mL/min (ref >60.00)
Glucose, Bld: 137 mg/dL — ABNORMAL HIGH (ref 70–99)
Potassium: 4 mEq/L (ref 3.5–5.1)
Sodium: 137 mEq/L (ref 135–145)
Total Bilirubin: 0.3 mg/dL (ref 0.2–1.2)
Total Protein: 8.2 g/dL (ref 6.0–8.3)

## 2020-03-22 LAB — HEMOGLOBIN A1C: Hgb A1c MFr Bld: 6.7 % — ABNORMAL HIGH (ref 4.6–6.5)

## 2020-03-22 MED ORDER — DOXYCYCLINE HYCLATE 100 MG PO TABS: 100.0000 mg | ORAL_TABLET | Freq: Two times a day (BID) | ORAL | 0 refills | Status: DC

## 2020-03-22 MED ORDER — CEFTRIAXONE SODIUM 500 MG IJ SOLR
500.0000 mg | Freq: Once | INTRAMUSCULAR | Status: AC
  Administered 2020-03-22: 500 mg via INTRAMUSCULAR

## 2020-03-22 MED ORDER — MUPIROCIN 2 % EX OINT: 1.0000 "application " | TOPICAL_OINTMENT | Freq: Two times a day (BID) | CUTANEOUS | 0 refills | Status: DC

## 2020-03-22 NOTE — Progress Notes (Signed)
Kyle Marsh is a 39 y.o. male with the following history as recorded in EpicCare:  Patient Active Problem List   Diagnosis Date Noted  . Low testosterone in male 09/25/2018  . Sleep apnea 09/25/2018  . Benign hypertension 02/05/2017  . GERD 11/01/2009  . WEIGHT GAIN, ABNORMAL 11/01/2009    Current Outpatient Medications  Medication Sig Dispense Refill  . amLODipine (NORVASC) 10 MG tablet Take 1 tablet (10 mg total) by mouth daily. 90 tablet 1  . blood glucose meter kit and supplies KIT Dispense based on patient and insurance preference. Use up to four times daily as directed. (FOR ICD-9 250.00, 250.01). 1 each 0  . clomiPHENE (CLOMID) 50 MG tablet Take 25 mg by mouth daily.    . Dulaglutide (TRULICITY) 1.61 WR/6.0AV SOPN Inject 0.75 mg into the skin once a week. 4 pen 1  . furosemide (LASIX) 20 MG tablet 1 tablet daily; may take as 2nd dose in the pm as needed 180 tablet 0  . KLOR-CON M20 20 MEQ tablet TAKE 1 TABLET BY MOUTH EVERY DAY 90 tablet 1  . losartan (COZAAR) 100 MG tablet Take 1 tablet (100 mg total) by mouth daily. 90 tablet 0  . metFORMIN (GLUCOPHAGE) 1000 MG tablet Take 1 tablet (1,000 mg total) by mouth 2 (two) times daily with a meal. 180 tablet 3  . Multiple Vitamin (MULTI VITAMIN MENS PO) Take by mouth.    Kyle Marsh 40J MISC USE UP TO 4 TIMES A DAY    . ONETOUCH ULTRA test strip USE UP TO 4 TIMES A DAY AS DIRECTED 100 strip 1  . sildenafil (VIAGRA) 100 MG tablet TAKE 1 TABLET BY MOUTH EVERY DAY AS NEEDED FOR ERECTILE DYSFUNCTION 6 tablet 1  . doxycycline (VIBRA-TABS) 100 MG tablet Take 1 tablet (100 mg total) by mouth 2 (two) times daily. 20 tablet 0  . mupirocin ointment (BACTROBAN) 2 % Apply 1 application topically 2 (two) times daily. 22 g 0   No current facility-administered medications for this visit.    Allergies: Patient has no known allergies.  Past Medical History:  Diagnosis Date  . Diabetes mellitus without complication ([place])   .  Hypertension     No past surgical history on file.  Family History  Problem Relation Age of Onset  . Arthritis Mother   . Diabetes Mother   . High blood pressure Mother   . Arthritis Father   . High Cholesterol Father   . High blood pressure Father   . Diabetes Maternal Grandmother   . Kidney disease Maternal Grandmother   . Cancer Paternal Grandmother   . Diabetes Paternal Grandmother   . High blood pressure Paternal Grandmother   . Arthritis Paternal Grandfather   . Diabetes Paternal Grandfather     Social History   Tobacco Use  . Smoking status: Never Smoker  . Smokeless tobacco: Never Used  Substance Use Topics  . Alcohol use: No    Subjective:   Wound on right lower shin x 2 weeks; thinks he may have been bitten by an insect while cutting the grass; has been self-medicating with  Vasoline and hydrogen peroxide;   Also wants to get labs updated to follow up on his diabetes; currently only taking Metformin twice a day; opted to hold Trulicity due to cost concerns. Having no further problems with urinary frequency or incontinence.  Objective:  Vitals:   03/22/20 1156  BP: 140/90  Pulse: 96  Temp: 98.4 F (36.9  C)  TempSrc: Oral  SpO2: 97%  Weight: (!) 407 lb 9.6 oz (184.9 kg)  Height: 6' 1" (1.854 m)    General: Well developed, well nourished, in no acute distress  Skin : Warm and dry. Infected wound noted on lower right shin with surrounding erythema- no streaking noted;  Head: Normocephalic and atraumatic  Eyes: Sclera and conjunctiva clear; pupils round and reactive to light; extraocular movements intact  Ears: External normal; canals clear; tympanic membranes normal  Oropharynx: Pink, supple. No suspicious lesions  Neck: Supple without thyromegaly, adenopathy  Lungs: Respirations unlabored; clear to auscultation bilaterally without wheeze, rales, rhonchi  CVS exam: normal rate and regular rhythm.  Musculoskeletal: No deformities; no active joint inflammation   Extremities: No edema, cyanosis, clubbing  Vessels: Symmetric bilaterally  Neurologic: Alert and oriented; speech intact; face symmetrical; moves all extremities well; CNII-XII intact without focal deficit   Assessment:  1. Insect bite of right lower leg, initial encounter   2. Type 2 diabetes mellitus without complication, without long-term current use of insulin ([place])     Plan:  1. Rocephin IM 500 mg given in office today; Rx for Doxycycline 100 mg bid x 10 days; follow-up worse, no better. 2. Update labs today to determine response to Metformin; follow-up to be determined.  This visit occurred during the SARS-CoV-2 public health emergency.  Safety protocols were in place, including screening questions prior to the visit, additional usage of staff PPE, and extensive cleaning of exam room while observing appropriate contact time as indicated for disinfecting solutions.     No follow-ups on file.  Orders Placed This Encounter  Procedures  . Comp Met (CMET)  . HgB A1c    Requested Prescriptions   Signed Prescriptions Disp Refills  . doxycycline (VIBRA-TABS) 100 MG tablet 20 tablet 0    Sig: Take 1 tablet (100 mg total) by mouth 2 (two) times daily.  . mupirocin ointment (BACTROBAN) 2 % 22 g 0    Sig: Apply 1 application topically 2 (two) times daily.

## 2020-04-03 ENCOUNTER — Other Ambulatory Visit: Payer: Self-pay | Admitting: Family

## 2020-04-03 ENCOUNTER — Telehealth: Payer: Self-pay | Admitting: Family

## 2020-04-03 ENCOUNTER — Telehealth: Payer: Self-pay

## 2020-04-03 MED ORDER — LOSARTAN POTASSIUM 100 MG PO TABS: 100.0000 mg | ORAL_TABLET | Freq: Every day | ORAL | 0 refills | Status: DC

## 2020-04-03 NOTE — Telephone Encounter (Signed)
New message:    1.Medication Requested: losartan (COZAAR) 100 MG tablet 2. Pharmacy (Name, Street, [place]): [id number] - [place], [place] - [address] 3. On Med List: yes  4. Last Visit with PCP: 03/22/20  5. Next visit date with PCP: None   Agent: Please be advised that RX refills may take up to 3 business days. We ask that you follow-up with your pharmacy.

## 2020-04-03 NOTE — Telephone Encounter (Signed)
1.Medication Requested:losartan (COZAAR) 100 MG tablet  2. Pharmacy (Name, Street, City):[id number] , [place] - [address]  3. On Med List: Yes   4. Last Visit with PCP: 6.2.21   5. Next visit date with PCP: n/a    Agent: Please be advised that RX refills may take up to 3 business days. We ask that you follow-up with your pharmacy.

## 2020-04-03 NOTE — Telephone Encounter (Signed)
Last OV for HTN 01/04/20; was seen 02/09/20 for DM.   Last refill 12/21/19 Next OV was scheduled for 04/10/20, but is canceled with no scheduled future visit.

## 2020-04-10 ENCOUNTER — Ambulatory Visit: Payer: Commercial Managed Care - PPO | Admitting: Family

## 2020-05-15 ENCOUNTER — Ambulatory Visit: Payer: Commercial Managed Care - PPO | Admitting: Family

## 2020-05-15 ENCOUNTER — Other Ambulatory Visit: Payer: Self-pay

## 2020-05-15 ENCOUNTER — Encounter: Payer: Self-pay | Admitting: Family

## 2020-05-15 VITALS — BP 152/70 | HR 96 | Temp 99.0°F | Wt >= 6400 oz

## 2020-05-15 DIAGNOSIS — S81801D Unspecified open wound, right lower leg, subsequent encounter: Secondary | ICD-10-CM | POA: Diagnosis not present

## 2020-05-15 DIAGNOSIS — I10 Essential (primary) hypertension: Secondary | ICD-10-CM | POA: Diagnosis not present

## 2020-05-15 DIAGNOSIS — R600 Localized edema: Secondary | ICD-10-CM | POA: Diagnosis not present

## 2020-05-15 DIAGNOSIS — E119 Type 2 diabetes mellitus without complications: Secondary | ICD-10-CM

## 2020-05-15 MED ORDER — MUPIROCIN 2 % EX OINT: 1.0000 "application " | TOPICAL_OINTMENT | Freq: Two times a day (BID) | CUTANEOUS | 0 refills | Status: DC

## 2020-05-15 MED ORDER — DOXYCYCLINE HYCLATE 100 MG PO TABS: 100.0000 mg | ORAL_TABLET | Freq: Two times a day (BID) | ORAL | 0 refills | Status: DC

## 2020-05-15 NOTE — Progress Notes (Signed)
Kyle Marsh is a 39 y.o. male with the following history as recorded in EpicCare:  Patient Active Problem List   Diagnosis Date Noted  . Low testosterone in male 09/25/2018  . Sleep apnea 09/25/2018  . Benign hypertension 02/05/2017  . GERD 11/01/2009  . WEIGHT GAIN, ABNORMAL 11/01/2009    Current Outpatient Medications  Medication Sig Dispense Refill  . amLODipine (NORVASC) 10 MG tablet Take 1 tablet (10 mg total) by mouth daily. 90 tablet 1  . blood glucose meter kit and supplies KIT Dispense based on patient and insurance preference. Use up to four times daily as directed. (FOR ICD-9 250.00, 250.01). 1 each 0  . furosemide (LASIX) 20 MG tablet 1 tablet daily; may take as 2nd dose in the pm as needed 180 tablet 0  . KLOR-CON M20 20 MEQ tablet TAKE 1 TABLET BY MOUTH EVERY DAY 90 tablet 1  . losartan (COZAAR) 100 MG tablet Take 1 tablet (100 mg total) by mouth daily. 90 tablet 0  . metFORMIN (GLUCOPHAGE) 1000 MG tablet Take 1 tablet (1,000 mg total) by mouth 2 (two) times daily with a meal. 180 tablet 3  . Multiple Vitamin (MULTI VITAMIN MENS PO) Take by mouth.    . mupirocin ointment (BACTROBAN) 2 % Apply 1 application topically 2 (two) times daily. 22 g 0  . OneTouch Delica Lancets 79K MISC USE UP TO 4 TIMES A DAY    . ONETOUCH ULTRA test strip USE UP TO 4 TIMES A DAY AS DIRECTED 100 strip 1  . sildenafil (VIAGRA) 100 MG tablet TAKE 1 TABLET BY MOUTH EVERY DAY AS NEEDED FOR ERECTILE DYSFUNCTION 6 tablet 1  . clomiPHENE (CLOMID) 50 MG tablet Take 25 mg by mouth daily. (Patient not taking: Reported on 05/15/2020)    . doxycycline (VIBRA-TABS) 100 MG tablet Take 1 tablet (100 mg total) by mouth 2 (two) times daily. 28 tablet 0   No current facility-administered medications for this visit.    Allergies: Patient has no known allergies.  Past Medical History:  Diagnosis Date  . Diabetes mellitus without complication ([place])   . Hypertension     No past surgical history on file.  Family  History  Problem Relation Age of Onset  . Arthritis Mother   . Diabetes Mother   . High blood pressure Mother   . Arthritis Father   . High Cholesterol Father   . High blood pressure Father   . Diabetes Maternal Grandmother   . Kidney disease Maternal Grandmother   . Cancer Paternal Grandmother   . Diabetes Paternal Grandmother   . High blood pressure Paternal Grandmother   . Arthritis Paternal Grandfather   . Diabetes Paternal Grandfather     Social History   Tobacco Use  . Smoking status: Never Smoker  . Smokeless tobacco: Never Used  Substance Use Topics  . Alcohol use: No    Subjective:  Seen on June 2 and treated for skin infection; was given 10 days of antibiotic- completed medication but waited another month to follow-up; "tried to get it heal myself." Continuing to struggle with weight/ blood pressure- admits has not taken his medication today; feels like he has "fluid leaking out of his legs" on occasion even with the Lasix bid;   Objective:  Vitals:   05/15/20 1612  BP: (!) 152/70  Pulse: 96  Temp: 99 F (37.2 C)  TempSrc: Oral  SpO2: 94%  Weight: (!) 401 lb 12.8 oz (182.3 kg)    General:  Well developed, well nourished, in no acute distress  Skin : Warm and dry. Wound noted on right leg- not as deep compared to initial exam; no streaking or warmth noted Head: Normocephalic and atraumatic  Lungs: Respirations unlabored; clear to auscultation bilaterally without wheeze, rales, rhonchi  CVS exam: normal rate and regular rhythm.  Extremities: No edema, cyanosis, clubbing  Vessels: Symmetric bilaterally  Neurologic: Alert and oriented; speech intact; face symmetrical; moves all extremities well; CNII-XII intact without focal deficit   Assessment:  1. Pedal edema   2. Morbid obesity ([place])   3. Non-healing wound of lower extremity, right, subsequent encounter   4. Essential hypertension   5. Type 2 diabetes mellitus without complication, without long-term  current use of insulin ([place])     Plan:  Unable to get labs today due to time of patient's appointment-our lab has already closed; will refer to cardiology for medication evaluation/ ? Heart failure or if edema is just due to his size; may also need vascular consult; Re-start doxycycline 100 mg bid x 2 weeks- he understands to follow-up right away if the wound is not healing once medicaiton is completed;  Plan for diabetes follow-up in 6 weeks; last Hgba1c was controlled; he defers meeting with nutritionist at this time- working on diet changes at home;   This visit occurred during the SARS-CoV-2 public health emergency.  Safety protocols were in place, including screening questions prior to the visit, additional usage of staff PPE, and extensive cleaning of exam room while observing appropriate contact time as indicated for disinfecting solutions.     No follow-ups on file.  Orders Placed This Encounter  Procedures  . Ambulatory referral to Cardiology    Referral Priority:   Routine    Referral Type:   Consultation    Referral Reason:   Specialty Services Required    Requested Specialty:   Cardiology    Number of Visits Requested:   1    Requested Prescriptions   Signed Prescriptions Disp Refills  . doxycycline (VIBRA-TABS) 100 MG tablet 28 tablet 0    Sig: Take 1 tablet (100 mg total) by mouth 2 (two) times daily.  . mupirocin ointment (BACTROBAN) 2 % 22 g 0    Sig: Apply 1 application topically 2 (two) times daily.

## 2020-06-23 ENCOUNTER — Ambulatory Visit: Payer: Commercial Managed Care - PPO | Admitting: Internal Medicine

## 2020-06-23 ENCOUNTER — Ambulatory Visit: Payer: Commercial Managed Care - PPO | Admitting: Family

## 2020-06-23 ENCOUNTER — Other Ambulatory Visit: Payer: Self-pay

## 2020-06-23 ENCOUNTER — Encounter: Payer: Self-pay | Admitting: Internal Medicine

## 2020-06-23 VITALS — BP 165/80 | HR 91 | Temp 98.6°F | Ht 74.0 in | Wt 399.0 lb

## 2020-06-23 VITALS — BP 158/74 | HR 91 | Ht 74.0 in | Wt >= 6400 oz

## 2020-06-23 DIAGNOSIS — G4733 Obstructive sleep apnea (adult) (pediatric): Secondary | ICD-10-CM

## 2020-06-23 DIAGNOSIS — R0602 Shortness of breath: Secondary | ICD-10-CM

## 2020-06-23 DIAGNOSIS — I83813 Varicose veins of bilateral lower extremities with pain: Secondary | ICD-10-CM

## 2020-06-23 DIAGNOSIS — Z9989 Dependence on other enabling machines and devices: Secondary | ICD-10-CM

## 2020-06-23 DIAGNOSIS — I10 Essential (primary) hypertension: Secondary | ICD-10-CM

## 2020-06-23 DIAGNOSIS — E119 Type 2 diabetes mellitus without complications: Secondary | ICD-10-CM | POA: Diagnosis not present

## 2020-06-23 DIAGNOSIS — R600 Localized edema: Secondary | ICD-10-CM | POA: Diagnosis not present

## 2020-06-23 MED ORDER — AMLODIPINE BESYLATE 10 MG PO TABS: 10.0000 mg | ORAL_TABLET | Freq: Every day | ORAL | 1 refills | Status: DC

## 2020-06-23 NOTE — Progress Notes (Signed)
OFFICE CONSULT NOTE  Chief Complaint:  Leg edema  Primary Care Physician: Olive Bass, FNP  HPI:  Kyle Marsh is a 39 y.o. male who is being seen today for the evaluation of leg edema at the request of Olive Bass,*.  This is a very pleasant 39 year old male who works in [employer].  He has had issues with lower extremity edema, morbid obesity, hypertension and type 2 diabetes without any complications.  More recently he had a nonhealing wound in the right gaiter area of the lower extremity, which is suspicious for a venous ulcer.  In fact he does have bilateral varicose veins and there is a family history of this.  He has significant edema which more recently has been improved with increasing doses of diuretics.  He has been wearing some compression stockings that he got from his mother but was not prescribed them.  In addition there is significant morbid obesity with a weight of 400 pounds.  He has had difficulty getting that weight off.  He reports some occasional shortness of breath with exertion but mainly symptoms of leg heaviness, restlessness and achiness, associated with the swelling.  Blood pressure was elevated today 158/74.  He reports compliance with his medications including losartan for blood pressure.  Lipids in March 2021 showed total cholesterol 199, HDL 45 LDL 122 and triglycerides 725.  He does have an his history of obstructive sleep apnea which was severe with an AHI of about 75/h.  He was started on auto titrating CPAP therapy based on a home study in 2019 through [facility] but was not working and did not follow-up with them in the last couple years.  He says he is having some issues with his face mask and may need new equipment.  PMHx:  Past Medical History:  Diagnosis Date  . Diabetes mellitus without complication ([facility])   . ED (erectile dysfunction)   . Hypertension   . Morbid obesity ([facility])   . OSA on CPAP   . Varicose veins of both legs  with edema     No past surgical history on file.  FAMHx:  Family History  Problem Relation Age of Onset  . Arthritis Mother   . Diabetes Mother   . High blood pressure Mother   . Arthritis Father   . High Cholesterol Father   . High blood pressure Father   . Diabetes Maternal Grandmother   . Kidney disease Maternal Grandmother   . Cancer Paternal Grandmother   . Diabetes Paternal Grandmother   . High blood pressure Paternal Grandmother   . Arthritis Paternal Grandfather   . Diabetes Paternal Grandfather     SOCHx:   reports that he has never smoked. He has never used smokeless tobacco. He reports that he does not drink alcohol and does not use drugs.  ALLERGIES:  No Known Allergies  ROS: Pertinent items noted in HPI and remainder of comprehensive ROS otherwise negative.  HOME MEDS: Current Outpatient Medications on File Prior to Visit  Medication Sig Dispense Refill  . furosemide (LASIX) 20 MG tablet 1 tablet daily; may take as 2nd dose in the pm as needed 180 tablet 0  . losartan (COZAAR) 100 MG tablet Take 1 tablet (100 mg total) by mouth daily. 90 tablet 0  . metFORMIN (GLUCOPHAGE) 1000 MG tablet Take 1 tablet (1,000 mg total) by mouth 2 (two) times daily with a meal. 180 tablet 3  . Multiple Vitamin (MULTI VITAMIN MENS PO) Take by  mouth.    . OneTouch Delica Lancets 33G MISC USE UP TO 4 TIMES A DAY    . ONETOUCH ULTRA test strip USE UP TO 4 TIMES A DAY AS DIRECTED 100 strip 1  . sildenafil (VIAGRA) 100 MG tablet TAKE 1 TABLET BY MOUTH EVERY DAY AS NEEDED FOR ERECTILE DYSFUNCTION 6 tablet 1   No current facility-administered medications on file prior to visit.    LABS/IMAGING: No results found for this or any previous visit (from the past 48 hour(s)). No results found.  LIPID PANEL:    Component Value Date/Time   CHOL 199 01/04/2020 1043   TRIG 162.0 (H) 01/04/2020 1043   HDL 44.80 01/04/2020 1043   CHOLHDL 4 01/04/2020 1043   VLDL 32.4 01/04/2020 1043    LDLCALC 122 (H) 01/04/2020 1043    WEIGHTS: Wt Readings from Last 3 Encounters:  06/23/20 (!) 400 lb 9.6 oz (181.7 kg)  05/15/20 (!) 401 lb 12.8 oz (182.3 kg)  03/22/20 (!) 407 lb 9.6 oz (184.9 kg)    VITALS: BP (!) 158/74   Pulse 91   Ht 6\' 2"  (1.88 m)   Wt (!) 400 lb 9.6 oz (181.7 kg)   SpO2 96%   BMI 51.43 kg/m   EXAM: General appearance: alert, no distress and morbidly obese Neck: no carotid bruit, no JVD and thyroid not enlarged, symmetric, no tenderness/mass/nodules Lungs: clear to auscultation bilaterally Heart: regular rate and rhythm Abdomen: soft, non-tender; bowel sounds normal; no masses,  no organomegaly and obese Extremities: edema 2+ bilateral, varicose veins noted, venous stasis dermatitis noted and healing venous stasis ulcer in the right gaitor area Pulses: 2+ and symmetric Skin: bilateral LE venous stasis dermatitis Neurologic: Grossly normal Psych: Pleasant  EKG: Normal sinus rhythm at 91, possible left atrial enlargement- personally reviewed  ASSESSMENT: 1. Symptomatic bilateral lower extremity varicose veins with edema and nonhealing ulcer, CEAP 1, 2, 3, 4A 2. Morbid obesity 3. Possible cor pulmonale 4. Hypertension 5. Type 2 diabetes 6. Severe OSA on CPAP  PLAN: 1.   [name] has symptomatic bilateral lower extremity varicose veins and a healing ulcer in the right gaiter area.  This suggest more aggressive therapy may be necessary.  I will fit him for 20 to 30 mmHg knee-high stockings bilaterally today and encourage wearing them consistently.  In addition we will arrange for venous insufficiency studies at [facility].  If there is significant reflux, he may be a candidate for venous ablation.  In addition weight loss obviously is significantly important.  I will refer him to [facility].  Finally we will check an echo to make sure that there is no significant right heart dysfunction as a result of his upper airway resistance/morbid  obesity.  He had issues with his CPAP and I have encouraged him to reach out back to [facility] to help him with his mask and equipment.  Plan follow-up with me in a few months after his studies.  Thanks again for the kind referral.  [facility], MD, [facility]  [place]  [facility]  Medical Director of the [facility] Lipid Disorders &  Cardiovascular Risk Reduction Clinic Diplomate of the American Board of Clinical Lipidology Attending Cardiologist  Direct Dial: [phone]  Fax: [phone]  Website:  [phone] Chanay Nugent 06/23/2020, 1:53 PM

## 2020-06-23 NOTE — Progress Notes (Signed)
Kyle Marsh is a 39 y.o. male with the following history as recorded in EpicCare:  Patient Active Problem List   Diagnosis Date Noted  . Low testosterone in male 09/25/2018  . Sleep apnea 09/25/2018  . Benign hypertension 02/05/2017  . GERD 11/01/2009  . WEIGHT GAIN, ABNORMAL 11/01/2009    Current Outpatient Medications  Medication Sig Dispense Refill  . furosemide (LASIX) 20 MG tablet 1 tablet daily; may take as 2nd dose in the pm as needed 180 tablet 0  . losartan (COZAAR) 100 MG tablet Take 1 tablet (100 mg total) by mouth daily. 90 tablet 0  . metFORMIN (GLUCOPHAGE) 1000 MG tablet Take 1 tablet (1,000 mg total) by mouth 2 (two) times daily with a meal. 180 tablet 3  . Multiple Vitamin (MULTI VITAMIN MENS PO) Take by mouth.    Kyle Marsh Lancets 41R MISC USE UP TO 4 TIMES A DAY    . ONETOUCH ULTRA test strip USE UP TO 4 TIMES A DAY AS DIRECTED 100 strip 1  . amLODipine (NORVASC) 10 MG tablet Take 1 tablet (10 mg total) by mouth daily. 90 tablet 1  . sildenafil (VIAGRA) 100 MG tablet TAKE 1 TABLET BY MOUTH EVERY DAY AS NEEDED FOR ERECTILE DYSFUNCTION (Patient not taking: Reported on 06/23/2020) 6 tablet 1   No current facility-administered medications for this visit.    Allergies: Patient has no known allergies.  Past Medical History:  Diagnosis Date  . Diabetes mellitus without complication (Kyle Marsh)   . ED (erectile dysfunction)   . Hypertension   . Morbid obesity (Kyle Marsh)   . OSA on CPAP   . Varicose veins of both legs with edema     No past surgical history on file.  Family History  Problem Relation Age of Onset  . Arthritis Mother   . Diabetes Mother   . High blood pressure Mother   . Arthritis Father   . High Cholesterol Father   . High blood pressure Father   . Diabetes Maternal Grandmother   . Kidney disease Maternal Grandmother   . Cancer Paternal Grandmother   . Diabetes Paternal Grandmother   . High blood pressure Paternal Grandmother   . Arthritis  Paternal Grandfather   . Diabetes Paternal Grandfather     Social History   Tobacco Use  . Smoking status: Never Smoker  . Smokeless tobacco: Never Used  Substance Use Topics  . Alcohol use: No    Subjective:  Patient presents to follow-up on hypertension/ Type 2 Diabetes; has met with cardiology due to concerns for circulatory concerns- will be having an echo and has been prescribed compression stockings; has also been referred to a weight loss specialist; cardiology not making medication adjustments until testing is done;      Objective:  Vitals:   06/23/20 1502  BP: (!) 165/80  Pulse: 91  Temp: 98.6 F (37 C)  TempSrc: Oral  SpO2: 95%  Weight: (!) 399 lb (181 kg)  Height: '6\' 2"'  (1.88 m)    General: Well developed, well nourished, in no acute distress  Skin : Warm and dry.  Head: Normocephalic and atraumatic  Lungs: Respirations unlabored; clear to auscultation bilaterally without wheeze, rales, rhonchi  CVS exam: normal rate and regular rhythm.  Abdomen: Soft; nontender; nondistended; normoactive bowel sounds; no masses or hepatosplenomegaly  Musculoskeletal: No deformities; no active joint inflammation  Extremities: No edema, cyanosis, clubbing  Vessels: Symmetric bilaterally  Neurologic: Alert and oriented; speech intact; face symmetrical; moves all  extremities well; CNII-XII intact without focal deficit   Assessment:  1. Type 2 diabetes mellitus without complication, without long-term current use of insulin (Kyle Marsh)   2. Essential hypertension   3. Morbid obesity (Kyle Marsh)     Plan:  1. Update Hgba1c today; continue Metformin; follow-up to be determined; 2. Uncontrolled; undergoing cardiac testing- medications will need to be adjusted based on test results; 3. Keep planned follow-up with weight loss specialist;  This visit occurred during the SARS-CoV-2 public health emergency.  Safety protocols were in place, including screening questions prior to the visit,  additional usage of staff PPE, and extensive cleaning of exam room while observing appropriate contact time as indicated for disinfecting solutions.     No follow-ups on file.  Orders Placed This Encounter  Procedures  . Comp Met (CMET)    Standing Status:   Future    Number of Occurrences:   1    Standing Expiration Date:   06/23/2021  . Hemoglobin A1c    Standing Status:   Future    Number of Occurrences:   1    Standing Expiration Date:   06/23/2021    Requested Prescriptions   Signed Prescriptions Disp Refills  . amLODipine (NORVASC) 10 MG tablet 90 tablet 1    Sig: Take 1 tablet (10 mg total) by mouth daily.

## 2020-06-23 NOTE — Patient Instructions (Addendum)
Medication Instructions:  No Changes In Medications at this time.   *If you need a refill on your cardiac medications before your next appointment, please call your pharmacy*  Lab Work: None Ordered At This Time.   If you have labs (blood work) drawn today and your tests are completely normal, you will receive your results only by: Marland Kitchen MyChart Message (if you have MyChart) OR . A paper copy in the mail If you have any lab test that is abnormal or we need to change your treatment, we will call you to review the results.  Testing/Procedures: Your physician has requested that you have a lower extremity vascular venous reflux study. This will take place at [facility].    Your physician has requested that you have an echocardiogram. Echocardiography is a painless test that uses sound waves to create images of your heart. It provides your doctor with information about the size and shape of your heart and how well your heart's chambers and valves are working. This procedure takes approximately one hour. There are no restrictions for this procedure.This will take place at [address]  Follow-Up: At [facility], you and your health needs are our priority.  As part of our continuing mission to provide you with exceptional heart care, we have created designated Provider Care Teams.  These Care Teams include your primary Cardiologist (physician) and Advanced Practice Providers (APPs -  Physician Assistants and Nurse Practitioners) who all work together to provide you with the care you need, when you need it.  Your next appointment:   2 month(s)  The format for your next appointment:   In Person  Provider:   Kirtland Bouchard [place] Hilty, MD  Other Instructions  Order given for Compression Stockings 20-15mmhg.   You have been referred to Dr. Dalbert [facility].

## 2020-06-24 LAB — COMPREHENSIVE METABOLIC PANEL
AG Ratio: 1.3 (calc) (ref 1.0–2.5)
ALT: 23 U/L (ref 9–46)
AST: 18 U/L (ref 10–40)
Albumin: 4.3 g/dL (ref 3.6–5.1)
Alkaline phosphatase (APISO): 53 U/L (ref 36–130)
BUN: 13 mg/dL (ref 7–25)
CO2: 33 mmol/L — ABNORMAL HIGH (ref 20–32)
Calcium: 9.6 mg/dL (ref 8.6–10.3)
Chloride: 96 mmol/L — ABNORMAL LOW (ref 98–110)
Creat: 0.94 mg/dL (ref 0.60–1.35)
Globulin: 3.4 g/dL (calc) (ref 1.9–3.7)
Glucose, Bld: 146 mg/dL — ABNORMAL HIGH (ref 65–99)
Potassium: 4.2 mmol/L (ref 3.5–5.3)
Sodium: 137 mmol/L (ref 135–146)
Total Bilirubin: 0.3 mg/dL (ref 0.2–1.2)
Total Protein: 7.7 g/dL (ref 6.1–8.1)

## 2020-06-24 LAB — HEMOGLOBIN A1C
Hgb A1c MFr Bld: 6.1 % of total Hgb — ABNORMAL HIGH (ref <5.7)
Mean Plasma Glucose: 128 (calc)
eAG (mmol/L): 7.1 (calc)

## 2020-06-28 ENCOUNTER — Telehealth: Payer: Self-pay | Admitting: Neurology

## 2020-06-28 ENCOUNTER — Ambulatory Visit (HOSPITAL_COMMUNITY): Payer: Commercial Managed Care - PPO | Attending: Cardiology

## 2020-06-28 ENCOUNTER — Other Ambulatory Visit: Payer: Self-pay

## 2020-06-28 DIAGNOSIS — R600 Localized edema: Secondary | ICD-10-CM | POA: Insufficient documentation

## 2020-06-28 DIAGNOSIS — R0602 Shortness of breath: Secondary | ICD-10-CM | POA: Insufficient documentation

## 2020-06-28 LAB — ECHOCARDIOGRAM COMPLETE
Area-P 1/2: 5.06 cm2
S' Lateral: 3.3 cm

## 2020-06-28 MED ORDER — PERFLUTREN LIPID MICROSPHERE
1.0000 mL | INTRAVENOUS | Status: AC | PRN
  Administered 2020-06-28: 2 mL via INTRAVENOUS

## 2020-06-28 NOTE — Telephone Encounter (Signed)
Pt walked in today asking about getting new parts for his CPAP machine but has not been seen since 2019 so he was unsure what steps needed to be taken . Best call back number is [phone]. Can leave a detailed message on the voice mail.

## 2020-06-28 NOTE — Telephone Encounter (Signed)
I called the pt and advised if he is having equipment issues he would need to call [facility] ( # provided). I also advised him should [facility] need updated office notes or orders from our office he is over due for any appt and we would need to see him.  Pt has been scheduled for 07/25/2020 with Dr. Frances Furbish.

## 2020-06-30 ENCOUNTER — Ambulatory Visit: Payer: Commercial Managed Care - PPO | Admitting: Family

## 2020-07-18 ENCOUNTER — Encounter (INDEPENDENT_AMBULATORY_CARE_PROVIDER_SITE_OTHER): Payer: Self-pay

## 2020-07-18 ENCOUNTER — Other Ambulatory Visit: Payer: Self-pay

## 2020-07-18 ENCOUNTER — Ambulatory Visit (INDEPENDENT_AMBULATORY_CARE_PROVIDER_SITE_OTHER): Payer: Commercial Managed Care - PPO | Admitting: Family Medicine

## 2020-07-24 ENCOUNTER — Encounter: Payer: Self-pay | Admitting: Neurology

## 2020-07-25 ENCOUNTER — Encounter: Payer: Self-pay | Admitting: Neurology

## 2020-07-25 ENCOUNTER — Other Ambulatory Visit: Payer: Self-pay

## 2020-07-25 ENCOUNTER — Ambulatory Visit: Payer: Commercial Managed Care - PPO | Admitting: Neurology

## 2020-07-25 VITALS — BP 134/76 | HR 98 | Ht 74.0 in | Wt 394.3 lb

## 2020-07-25 DIAGNOSIS — G4734 Idiopathic sleep related nonobstructive alveolar hypoventilation: Secondary | ICD-10-CM | POA: Diagnosis not present

## 2020-07-25 DIAGNOSIS — G4733 Obstructive sleep apnea (adult) (pediatric): Secondary | ICD-10-CM | POA: Diagnosis not present

## 2020-07-25 NOTE — Progress Notes (Signed)
Subjective:    Patient ID: Kyle Marsh is a 39 y.o. male.  HPI     Interim history:   Kyle Marsh is a 39 year old right-handed gentleman with an underlying medical history of hypertension, diabetes, reflux disease, low testosterone and morbid obesity with BMI of over 50, who Presents for follow-up consultation of his obstructive sleep apnea after over 2 years.  The patient is unaccompanied today.  I first met him at the request of his primary care nurse practitioner on 06/26/2018, at which time he reported snoring, witnessed apneas and excessive daytime somnolence.  39 was advised to proceed with a sleep study.  He had a home sleep test on 09/07/2018 which indicated severe obstructive sleep apnea with an AHI of 74.8/h, O2 nadir in the 50s.  He had significant nocturnal hypoxemia with time below or at 88% saturation of 406 minutes. Given his insurance coverage she was advised to start AutoPap therapy.  He did not return for follow-up.  He was supposed to have an overnight pulse oximetry test done after establishing on AutoPap therapy.  Today, 38/02/2020: I was not able to review his compliance download as he has the machine unplugged at home and he reports that he has not used it in about 3 months due to lack of updated supplies.  He also reports that he has not been as consistent with it as he should be.  He has been working on weight loss and goes to weight management.  He would be willing to get restarted on treatment but would like to see about getting a different style of mask.  He did not bring the compliance data chip to the appointment today.  He reports that he has been using a full facemask and would like to get a different style if possible.  He does report that he felt better when he was able to consistently use his machine and he would be willing to plug in the machine and start reusing it as soon as possible, he does need new supplies.  The patient's allergies, current medications, family  history, past medical history, past social history, past surgical history and problem list were reviewed and updated as appropriate.   Previously:   06/26/18: (He) reports snoring and excessive daytime somnolence.his wife has reported pause in his breathing while he is asleep. I reviewed your office note from 06/26/2018. His Epworth sleepiness score is 18 out of 24 today, fatigue score is 45 out of 63. He drinks caffeine in the form of coffee, one cup in the morning typically and soda, 2 servings per day on average. He works as a Administrator. He knows to pull over and rest when he is sleepy at the wheel. 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 He is married and lives with his wife and 3 sons, ages 59, 35 and 43. He is a nonsmoker and drinks alcohol occasionally, maybe 1/month. His bedtime is around 9 and rise time is around 2 AM. Has to be at work at 4 and works 10-12 hours daily, Monday through Friday and also some Saturdays. His mother has sleep apnea and uses a CPAP machine successfully. He has significant nocturia about 3 times for average night and has had morning headaches, about twice per week on average, typically dull and achy, does not take medication for it. His weight has fluctuated. He has recently been diagnosed with low testosterone and saw a endocrinologist. He has been started on clomiphene.  His Past Medical History Is Significant For: Past Medical History:  Diagnosis Date  . Diabetes mellitus without complication ([place])   . ED (erectile dysfunction)   . Hypertension   . Morbid obesity ([place])   . OSA on CPAP   . Varicose veins of both legs with edema     His Past Surgical History Is Significant For: History reviewed. No pertinent surgical history.  His Family History Is Significant For: Family History  Problem Relation Age of Onset  . Arthritis Mother   . Diabetes Mother   . High blood pressure Mother   . Arthritis Father   . High Cholesterol Father   . High blood  pressure Father   . Diabetes Maternal Grandmother   . Kidney disease Maternal Grandmother   . Cancer Paternal Grandmother   . Diabetes Paternal Grandmother   . High blood pressure Paternal Grandmother   . Arthritis Paternal Grandfather   . Diabetes Paternal Grandfather     His Social History Is Significant For: Social History   Socioeconomic History  . Marital status: Married    Spouse name: Not on file  . Number of children: Not on file  . Years of education: Not on file  . Highest education level: Not on file  Occupational History  . Not on file  Tobacco Use  . Smoking status: Never Smoker  . Smokeless tobacco: Never Used  Substance and Sexual Activity  . Alcohol use: No  . Drug use: No  . Sexual activity: Not on file  Other Topics Concern  . Not on file  Social History Narrative  . Not on file   Social Determinants of Health   Financial Resource Strain:   . Difficulty of Paying Living Expenses: Not on file  Food Insecurity:   . Worried About Charity fundraiser in the Last Year: Not on file  . Ran Out of Food in the Last Year: Not on file  Transportation Needs:   . Lack of Transportation (Medical): Not on file  . Lack of Transportation (Non-Medical): Not on file  Physical Activity:   . Days of Exercise per Week: Not on file  . Minutes of Exercise per Session: Not on file  Stress:   . Feeling of Stress : Not on file  Social Connections:   . Frequency of Communication with Friends and Family: Not on file  . Frequency of Social Gatherings with Friends and Family: Not on file  . Attends Religious Services: Not on file  . Active Member of Clubs or Organizations: Not on file  . Attends Archivist Meetings: Not on file  . Marital Status: Not on file    His Allergies Are:  No Known Allergies:   His Current Medications Are:  Outpatient Encounter Medications as of 07/25/2020  Medication Sig  . amLODipine (NORVASC) 10 MG tablet Take 1 tablet (10 mg  total) by mouth daily.  . furosemide (LASIX) 20 MG tablet 1 tablet daily; may take as 2nd dose in the pm as needed  . losartan (COZAAR) 100 MG tablet Take 1 tablet (100 mg total) by mouth daily.  . metFORMIN (GLUCOPHAGE) 1000 MG tablet Take 1 tablet (1,000 mg total) by mouth 2 (two) times daily with a meal.  . Multiple Vitamin (MULTI VITAMIN MENS PO) Take by mouth.  [name] Lancets 19Q MISC USE UP TO 4 TIMES A DAY  . ONETOUCH ULTRA test strip USE UP TO 4 TIMES A DAY AS DIRECTED  .  sildenafil (VIAGRA) 100 MG tablet TAKE 1 TABLET BY MOUTH EVERY DAY AS NEEDED FOR ERECTILE DYSFUNCTION   No facility-administered encounter medications on file as of 07/25/2020.  :  Review of Systems:  Out of a complete 14 point review of systems, all are reviewed and negative with the exception of these symptoms as listed below: Review of Systems  Neurological:       Here to f/u on CPAP. Reports he needs updated appointment to get refitted for mask. Pt did not bring his machine or card for downloading today.     Objective:  Neurological Exam  Physical Exam Physical Examination:   Vitals:   07/25/20 1109  BP: 134/76  Pulse: 98  SpO2: 94%    General Examination: The patient is a very pleasant 39 y.o. male in no acute distress. He appears well-developed and well-nourished and well groomed.   HEENT: Normocephalic, atraumatic, pupils are equal, round and reactive to light, tracking is well preserved. Hearing is grossly intact. Face is symmetric with normal facial animation and normal facial sensation. Speech is clear with no dysarthria noted. There is no hypophonia. There is no lip, neck/head, jaw or voice tremor. Neck is supple with full range of passive and active motion. There are no carotid bruits on auscultation. Oropharynx exam reveals: mild mouth dryness, adequate dental hygiene and marked airway crowding. Tongue protrudes centrally and palate elevates symmetrically.  Chest: Clear to  auscultation without wheezing, rhonchi or crackles noted.  Heart: S1+S2+0, regular and normal without murmurs, rubs or gallops noted.   Abdomen: Soft, non-tender and non-distended with normal bowel sounds appreciated on auscultation.  Extremities: There is  1-2+ edema in the left and right lower extremities, small area of healed scar from prior ulcer in the right shin area.    Skin: Warm and dry without trophic changes noted. There are no varicose veins.  Musculoskeletal: exam reveals no obvious joint deformities, tenderness or joint swelling or erythema.   Neurologically:  Mental status: The patient is awake, alert and oriented in all 4 spheres. His immediate and remote memory, attention, language skills and fund of knowledge are appropriate. There is no evidence of aphasia, agnosia, apraxia or anomia. Speech is clear with normal prosody and enunciation. Thought process is linear. Mood is normal and affect is normal.  Cranial nerves II - XII are as described above under HEENT exam.  Motor exam: Normal bulk, strength and tone is noted. There is no tremor. Fine motor skills and coordination: Grossly intact.   Cerebellar testing: No dysmetria or intention tremor. There is no truncal or gait ataxia.  Sensory exam: intact to light touch in the upper and lower extremities.  Gait, station and balance: He stands easily. No veering to one side is noted. No leaning to one side is noted. Posture is age-appropriate and stance is narrow based. Gait shows normal stride length and normal pace. No problems turning are noted.  Assessment and Plan:  In summary, Kyle Marsh is a very pleasant 39 year old male with an underlying medical history of hypertension, diabetes, reflux disease, low testosterone and morbid obesity with BMI of over 50, who presents for follow-up consultation of his obstructive sleep apnea which was deemed to be in the severe range by home sleep testing on 09/07/2018.  He has been  on AutoPap therapy but a compliance download was not possible.  He has not had any follow-up after his original testing with [place].  I was not able to get a compliance  download remotely as he has not plugged in the machine in about 3 months.  He needs new supplies.  He is willing to stay compliant with treatment.  He is interested in pursuing a different style of mask.  I wrote for new supplies as well as a mask refit through his DME company.  He is advised that severe sleep apnea needs to be consistently treated.  He is working also on weight loss and is commended for this.  In addition, I would like to proceed with an overnight pulse oximetry test while he is established on AutoPap therapy to make sure his oxygen saturations are adequate while on treatment.  He is willing to plug in the machine so we can access a download remotely or we will request 1 through his DME company.  We will call him with his pulse oximetry test results once they are faxed to [place].  He is advised to follow-up to see one of our nurse practitioners in 3 months, sooner if needed.  I answered all his questions today and he was in agreement with the plan.   I spent 30 minutes in total face-to-face time and in reviewing records during pre-charting, more than 50% of which was spent in counseling and coordination of care, reviewing test results, reviewing medications and treatment regimen and/or in discussing or reviewing the diagnosis of OSA, the prognosis and treatment options. Pertinent laboratory and imaging test results that were available during this visit with the patient were reviewed by me and considered in my medical decision making (see chart for details).

## 2020-07-25 NOTE — Patient Instructions (Addendum)
I have written for new supplies for your AutoPap machine.  Please plug in your machine as we may be able to access the data on the machine remotely.    Please restart using your machine and be consistent with your AutoPap compliance.  It is very important that you stay on treatment for sleep apnea as you have severe obstructive sleep apnea with severe desaturations. As discussed, we will do an overnight oxygen level test, called ONO, and your DME company will call and set this up for one night, while you also use your autoPAP as usual. We will call you with the results. This is to make sure that your oxygen levels stay in the 90s, while you are treated with autoPAP for your OSA. Remember, your oxygen levels dropped into the 50s during the home sleep test.   Please follow-up to see one of our nurse practitioners in 3 months.

## 2020-08-01 ENCOUNTER — Ambulatory Visit (INDEPENDENT_AMBULATORY_CARE_PROVIDER_SITE_OTHER): Payer: Self-pay | Admitting: Family Medicine

## 2020-08-03 ENCOUNTER — Ambulatory Visit (HOSPITAL_COMMUNITY)
Admission: RE | Admit: 2020-08-03 | Discharge: 2020-08-03 | Disposition: A | Discharge status: Home / Self Care | Payer: Commercial Managed Care - PPO | Source: Ambulatory Visit | Attending: Internal Medicine | Admitting: Internal Medicine

## 2020-08-03 ENCOUNTER — Other Ambulatory Visit: Payer: Self-pay

## 2020-08-03 DIAGNOSIS — R600 Localized edema: Secondary | ICD-10-CM | POA: Insufficient documentation

## 2020-08-03 DIAGNOSIS — I83813 Varicose veins of bilateral lower extremities with pain: Secondary | ICD-10-CM | POA: Insufficient documentation

## 2020-08-04 ENCOUNTER — Other Ambulatory Visit: Payer: Self-pay | Admitting: *Deleted

## 2020-08-04 DIAGNOSIS — I83813 Varicose veins of bilateral lower extremities with pain: Secondary | ICD-10-CM

## 2020-08-04 DIAGNOSIS — R600 Localized edema: Secondary | ICD-10-CM

## 2020-08-10 ENCOUNTER — Encounter: Payer: Self-pay | Admitting: Family

## 2020-08-15 ENCOUNTER — Other Ambulatory Visit: Payer: Self-pay | Admitting: Family

## 2020-08-24 ENCOUNTER — Ambulatory Visit: Payer: Commercial Managed Care - PPO | Admitting: Internal Medicine

## 2020-09-01 ENCOUNTER — Telehealth: Payer: Self-pay | Admitting: Family

## 2020-09-01 MED ORDER — LOSARTAN POTASSIUM 100 MG PO TABS: 100.0000 mg | ORAL_TABLET | Freq: Every day | ORAL | 2 refills | Status: DC

## 2020-09-01 NOTE — Telephone Encounter (Signed)
Message left on patient voicemail.

## 2020-09-01 NOTE — Telephone Encounter (Signed)
Mr. Woessner has made an appointment with Vernona Rieger for Tuesday 11.16.21. He is asking that the following med be refilled today:  losartan (COZAAR) 100 MG tablet  [id number] - [place], [place] - [address] Phone:  [phone]  Fax:  [phone]     On Med List: Y  Next appt: 11.16.21 .

## 2020-09-01 NOTE — Telephone Encounter (Signed)
The Losartan was sent to his pharmacy on 08/16/20. I will send to the pharmacy again however.  He really needs to see his cardiologist, Dr. Rennis Golden,  more than he needs a follow up with me right now.  His diabetes is fine- we did a Hgba1c in September and he is fine until March to get that re-checked.  If he still wants to keep the appointment for next week, he needs to bring all of the bottles of which medications he is currently taking.

## 2020-09-05 ENCOUNTER — Ambulatory Visit: Payer: Commercial Managed Care - PPO | Admitting: Family

## 2020-09-05 ENCOUNTER — Encounter: Payer: Self-pay | Admitting: Family

## 2020-09-05 ENCOUNTER — Telehealth: Payer: Self-pay

## 2020-09-05 ENCOUNTER — Other Ambulatory Visit: Payer: Self-pay

## 2020-09-05 VITALS — BP 138/78 | HR 108 | Temp 98.4°F | Ht 74.0 in | Wt >= 6400 oz

## 2020-09-05 DIAGNOSIS — I10 Essential (primary) hypertension: Secondary | ICD-10-CM

## 2020-09-05 DIAGNOSIS — E119 Type 2 diabetes mellitus without complications: Secondary | ICD-10-CM | POA: Diagnosis not present

## 2020-09-05 DIAGNOSIS — R7309 Other abnormal glucose: Secondary | ICD-10-CM | POA: Diagnosis not present

## 2020-09-05 DIAGNOSIS — Z23 Encounter for immunization: Secondary | ICD-10-CM

## 2020-09-05 LAB — COMPREHENSIVE METABOLIC PANEL
ALT: 22 U/L (ref 0–53)
AST: 17 U/L (ref 0–37)
Albumin: 4.1 g/dL (ref 3.5–5.2)
Alkaline Phosphatase: 49 U/L (ref 39–117)
BUN: 10 mg/dL (ref 6–23)
CO2: 36 mEq/L — ABNORMAL HIGH (ref 19–32)
Calcium: 9.2 mg/dL (ref 8.4–10.5)
Chloride: 97 mEq/L (ref 96–112)
Creatinine, Ser: 0.96 mg/dL (ref 0.40–1.50)
GFR: 100.12 mL/min (ref >60.00)
Glucose, Bld: 134 mg/dL — ABNORMAL HIGH (ref 70–99)
Potassium: 3.7 mEq/L (ref 3.5–5.1)
Sodium: 138 mEq/L (ref 135–145)
Total Bilirubin: 0.3 mg/dL (ref 0.2–1.2)
Total Protein: 7.7 g/dL (ref 6.0–8.3)

## 2020-09-05 LAB — HEMOGLOBIN A1C: Hgb A1c MFr Bld: 6.2 % (ref 4.6–6.5)

## 2020-09-05 MED ORDER — AMLODIPINE BESYLATE 10 MG PO TABS: 10.0000 mg | ORAL_TABLET | Freq: Every day | ORAL | 1 refills | Status: DC

## 2020-09-05 MED ORDER — LOSARTAN POTASSIUM 100 MG PO TABS: 100.0000 mg | ORAL_TABLET | Freq: Every day | ORAL | 3 refills | Status: DC

## 2020-09-05 NOTE — Progress Notes (Signed)
Kyle Marsh is a 39 y.o. male with the following history as recorded in EpicCare:  Patient Active Problem List   Diagnosis Date Noted  . Low testosterone in male 09/25/2018  . Sleep apnea 09/25/2018  . Benign hypertension 02/05/2017  . GERD 11/01/2009  . WEIGHT GAIN, ABNORMAL 11/01/2009    Current Outpatient Medications  Medication Sig Dispense Refill  . amLODipine (NORVASC) 10 MG tablet Take 1 tablet (10 mg total) by mouth daily. 90 tablet 1  . losartan (COZAAR) 100 MG tablet Take 1 tablet (100 mg total) by mouth daily. 90 tablet 3  . metFORMIN (GLUCOPHAGE) 1000 MG tablet Take 1 tablet (1,000 mg total) by mouth 2 (two) times daily with a meal. 180 tablet 3  . Multiple Vitamin (MULTI VITAMIN MENS PO) Take by mouth.    [name] Lancets 16X MISC USE UP TO 4 TIMES A DAY    . ONETOUCH ULTRA test strip USE UP TO 4 TIMES A DAY AS DIRECTED 100 strip 1   No current facility-administered medications for this visit.    Allergies: Patient has no known allergies.  Past Medical History:  Diagnosis Date  . Diabetes mellitus without complication ([place])   . ED (erectile dysfunction)   . Hypertension   . Morbid obesity ([place])   . OSA on CPAP   . Varicose veins of both legs with edema     History reviewed. No pertinent surgical history.  Family History  Problem Relation Age of Onset  . Arthritis Mother   . Diabetes Mother   . High blood pressure Mother   . Arthritis Father   . High Cholesterol Father   . High blood pressure Father   . Diabetes Maternal Grandmother   . Kidney disease Maternal Grandmother   . Cancer Paternal Grandmother   . Diabetes Paternal Grandmother   . High blood pressure Paternal Grandmother   . Arthritis Paternal Grandfather   . Diabetes Paternal Grandfather     Social History   Tobacco Use  . Smoking status: Never Smoker  . Smokeless tobacco: Never Used  Substance Use Topics  . Alcohol use: No    Subjective:  Follow up on hypertension and pedal  edema; currently only taking Amlodipine and Losartan for his blood pressure; has not used Lasix since earlier this year; has found compression stockings given earlier this year to be very beneficial with swelling; is questioning why his refill request for potassium was denied;   Would like to get his flu shot today;       Objective:  Vitals:   09/05/20 1220  BP: 138/78  Pulse: (!) 108  Temp: 98.4 F (36.9 C)  TempSrc: Oral  SpO2: 98%  Weight: (!) 400 lb 9.6 oz (181.7 kg)  Height: '6\' 2"'  (0.96 m)    General: Well developed, well nourished, in no acute distress  Skin : Warm and dry.  Head: Normocephalic and atraumatic  Eyes: Sclera and conjunctiva clear; pupils round and reactive to light; extraocular movements intact  Lungs: Respirations unlabored; clear to auscultation bilaterally without wheeze, rales, rhonchi  CVS exam: normal rate and regular rhythm.  Musculoskeletal: No deformities; no active joint inflammation  Extremities: No edema, cyanosis, clubbing  Vessels: Symmetric bilaterally  Neurologic: Alert and oriented; speech intact; face symmetrical; moves all extremities well; CNII-XII intact without focal deficit   Assessment:  1. Essential hypertension   2. Type 2 diabetes mellitus without complication, without long-term current use of insulin (HCC)   3. Elevated  glucose   4. Needs flu shot     Plan:  1. Stable; extensive medication review done today; he understands that since he is no longer using Lasix he does not need to take regular potassium supplements; will check CMP today; 2. Stable; check Hgba1c today; 4. Flu shot given;  Encouraged to follow-up with neurology regarding his CPAP and see cardiology as recommended; continue to work on weight loss goals; plan for 6 month follow up at this office.   This visit occurred during the SARS-CoV-2 public health emergency.  Safety protocols were in place, including screening questions prior to the visit, additional usage  of staff PPE, and extensive cleaning of exam room while observing appropriate contact time as indicated for disinfecting solutions.     No follow-ups on file.  Orders Placed This Encounter  Procedures  . Flu Vaccine QUAD 36+ mos IM  . Comp Met (CMET)    Standing Status:   Future    Number of Occurrences:   1    Standing Expiration Date:   09/05/2021  . Hemoglobin A1c    Standing Status:   Future    Number of Occurrences:   1    Standing Expiration Date:   09/05/2021    Requested Prescriptions   Signed Prescriptions Disp Refills  . losartan (COZAAR) 100 MG tablet 90 tablet 3    Sig: Take 1 tablet (100 mg total) by mouth daily.  Marland Kitchen amLODipine (NORVASC) 10 MG tablet 90 tablet 1    Sig: Take 1 tablet (10 mg total) by mouth daily.

## 2020-09-05 NOTE — Telephone Encounter (Signed)
Received ONO results for pt. Results report that pt completed ONO while on auto-pap but there is no wireless data from 08/26/2020 on the [employer] website for pt to confirm this. Last data was from 08/01/2020.  I called pt to discuss. No answer, left a message asking him to call me back.

## 2020-09-06 NOTE — Telephone Encounter (Signed)
Pt returned my call. He reports that he did not use his auto pap the night of his ONO. He is waiting on a new mask for his auto pap and therefore did not think to use his auto pap. He will need to redo the ONO on autopap. I will send this to [facility]. Pt verbalized understanding.  I have sent this order to [facility]. Received a receipt of confirmation.

## 2020-10-05 ENCOUNTER — Ambulatory Visit: Payer: Commercial Managed Care - PPO | Attending: Internal Medicine

## 2020-10-05 DIAGNOSIS — Z23 Encounter for immunization: Secondary | ICD-10-CM

## 2020-10-05 NOTE — Progress Notes (Signed)
   Covid-19 Vaccination Clinic  Name:  HUNT ZAJICEK    MRN: [id number] DOB: 10/20/1981  10/05/2020  Mr. Kyllonen was observed post Covid-19 immunization for 15 minutes without incident. He was provided with Vaccine Information Sheet and instruction to access the V-Safe system.   Mr. Molner was instructed to call 911 with any severe reactions post vaccine: Marland Kitchen Difficulty breathing  . Swelling of face and throat  . A fast heartbeat  . A bad rash all over body  . Dizziness and weakness   Immunizations Administered    Name Date Dose VIS Date Route   Moderna Covid-19 Booster Vaccine 10/05/2020  5:38 PM 0.25 mL 08/09/2020 Intramuscular   Manufacturer: Moderna   Lot: [id number]   NDC: 32951-884-16

## 2020-10-31 ENCOUNTER — Ambulatory Visit: Payer: Commercial Managed Care - PPO | Admitting: Family Medicine

## 2020-12-04 ENCOUNTER — Other Ambulatory Visit: Payer: Self-pay | Admitting: Family

## 2020-12-04 MED ORDER — VALSARTAN 160 MG PO TABS: 160.0000 mg | ORAL_TABLET | Freq: Every day | ORAL | 0 refills | Status: DC

## 2020-12-04 NOTE — Telephone Encounter (Signed)
Please let him know that there is a long term national backorder on Losartan; I can't get it for him and have sent in Valsartan 160 mg as alternative.

## 2020-12-05 NOTE — Telephone Encounter (Signed)
Pt notified that Valsartan has been called into pharmacy; verb understanding.

## 2021-01-03 ENCOUNTER — Other Ambulatory Visit: Payer: Self-pay | Admitting: Family

## 2021-01-10 ENCOUNTER — Telehealth: Payer: Self-pay | Admitting: Family

## 2021-01-10 NOTE — Telephone Encounter (Signed)
Patient is out of amLODipine (NORVASC) 10 MG tablet He has been taking an additional dose. Requesting early refill be [facility] to [id number] - [place], [place] - [address]

## 2021-01-10 NOTE — Telephone Encounter (Signed)
Attempted to call pt with no success. I did leave a VM to call back. No answer and I will try again.

## 2021-01-12 NOTE — Telephone Encounter (Signed)
I have called pharmacy and informed them that it is okay with [place] for the pt to pick it up early. They stated that the earliest that pt can pick it up with insurance covering it will be the 01/14/21. Pharmacy will call pt to let him know and if the pt wants to pick it up any earlier he will have to pay cash.

## 2021-01-12 NOTE — Telephone Encounter (Signed)
I have called the pt and went over his medications. He was under the impression that amlodipine is a fluid pill and not a BP medication so he has been doubling up on it to keep his leg swelling down. He has been using the compression stockings and that has helped. I have recommended that he comes in to go over his medications and address if a fluid pill is needed. He stated understanding and will call us to get that scheduled.   He stated that his BP is good and I will call the pharmacy to give him an early refill on the Amlodipine since he is completely out.

## 2021-01-16 NOTE — Telephone Encounter (Signed)
I have called pt and there was no answer so I left a message to call back informing him that he should have picked up his BP meds by now. If he needs additional medication or wants to talk about his leg swelling he will need to make an appointment.

## 2021-01-22 NOTE — Telephone Encounter (Signed)
I asked [facility] if pt had completed his ONO yet and this is their response: "He did not. I know we were having trouble reaching him, and now he has a past-due balance that needs to be addressed before management will allow [place] to do anything further for him."

## 2021-02-26 ENCOUNTER — Other Ambulatory Visit: Payer: Self-pay | Admitting: Family

## 2021-02-27 ENCOUNTER — Telehealth: Payer: Self-pay | Admitting: Family

## 2021-02-27 NOTE — Telephone Encounter (Signed)
Please remind him that he is due for OV in June; needs to decide if he is coming here or establishing with new PCP at [facility]- if staying at [facility], he should schedule with Dr. Yetta Barre or Dr. Okey Dupre.

## 2021-02-27 NOTE — Telephone Encounter (Signed)
Pt calling back- he can be reached at [phone].

## 2021-02-27 NOTE — Telephone Encounter (Signed)
Rx already sent.

## 2021-02-27 NOTE — Telephone Encounter (Signed)
Medication: metFORMIN (GLUCOPHAGE) 1000 MG tablet [[id number]     Has the patient contacted their pharmacy? no (If no, request that the patient contact the pharmacy for the refill.) (If yes, when and what did the pharmacy advise?)    Preferred Pharmacy (with phone number or street name):   [id number] - [place], [place] - [address] Phone:  [phone]  Fax:  [phone]         Agent: Please be advised that RX refills may take up to 3 business days. We ask that you follow-up with your pharmacy.

## 2021-02-27 NOTE — Telephone Encounter (Signed)
I have called the pt and relayed the message on his VM.

## 2021-02-27 NOTE — Telephone Encounter (Signed)
I have spoken to the Kyle Marsh and he stated that he will follow Vernona Rieger and he has an upcoming appointment. He was told to come in around 805 to ensure that he gets here on time.

## 2021-02-28 ENCOUNTER — Other Ambulatory Visit: Payer: Self-pay | Admitting: Family

## 2021-03-06 ENCOUNTER — Ambulatory Visit (INDEPENDENT_AMBULATORY_CARE_PROVIDER_SITE_OTHER): Payer: Commercial Managed Care - PPO | Admitting: Family

## 2021-03-06 ENCOUNTER — Telehealth: Payer: Self-pay | Admitting: Family

## 2021-03-06 ENCOUNTER — Other Ambulatory Visit: Payer: Self-pay

## 2021-03-06 ENCOUNTER — Encounter: Payer: Self-pay | Admitting: Family

## 2021-03-06 VITALS — BP 126/82 | HR 92 | Temp 97.4°F | Resp 18 | Ht 74.0 in | Wt 394.0 lb

## 2021-03-06 DIAGNOSIS — E119 Type 2 diabetes mellitus without complications: Secondary | ICD-10-CM

## 2021-03-06 DIAGNOSIS — R202 Paresthesia of skin: Secondary | ICD-10-CM

## 2021-03-06 DIAGNOSIS — I10 Essential (primary) hypertension: Secondary | ICD-10-CM

## 2021-03-06 DIAGNOSIS — R200 Anesthesia of skin: Secondary | ICD-10-CM

## 2021-03-06 LAB — COMPREHENSIVE METABOLIC PANEL
ALT: 17 U/L (ref 0–53)
AST: 15 U/L (ref 0–37)
Albumin: 4.4 g/dL (ref 3.5–5.2)
Alkaline Phosphatase: 52 U/L (ref 39–117)
BUN: 11 mg/dL (ref 6–23)
CO2: 36 mEq/L — ABNORMAL HIGH (ref 19–32)
Calcium: 9.5 mg/dL (ref 8.4–10.5)
Chloride: 98 mEq/L (ref 96–112)
Creatinine, Ser: 0.96 mg/dL (ref 0.40–1.50)
GFR: 99.77 mL/min (ref >60.00)
Glucose, Bld: 121 mg/dL — ABNORMAL HIGH (ref 70–99)
Potassium: 4.7 mEq/L (ref 3.5–5.1)
Sodium: 139 mEq/L (ref 135–145)
Total Bilirubin: 0.4 mg/dL (ref 0.2–1.2)
Total Protein: 7.8 g/dL (ref 6.0–8.3)

## 2021-03-06 LAB — LIPID PANEL
Cholesterol: 156 mg/dL (ref 0–200)
HDL: 44.1 mg/dL (ref >39.00)
LDL Cholesterol: 89 mg/dL (ref 0–99)
NonHDL: 111.85
Total CHOL/HDL Ratio: 4
Triglycerides: 115 mg/dL (ref 0.0–149.0)
VLDL: 23 mg/dL (ref 0.0–40.0)

## 2021-03-06 LAB — CBC WITH DIFFERENTIAL/PLATELET
Basophils Absolute: 0 10*3/uL (ref 0.0–0.1)
Basophils Relative: 1 % (ref 0.0–3.0)
Eosinophils Absolute: 0.1 10*3/uL (ref 0.0–0.7)
Eosinophils Relative: 2.4 % (ref 0.0–5.0)
HCT: 41.3 % (ref 39.0–52.0)
Hemoglobin: 13.4 g/dL (ref 13.0–17.0)
Lymphocytes Relative: 31.2 % (ref 12.0–46.0)
Lymphs Abs: 1.4 10*3/uL (ref 0.7–4.0)
MCHC: 32.4 g/dL (ref 30.0–36.0)
MCV: 88.9 fl (ref 78.0–100.0)
Monocytes Absolute: 0.4 10*3/uL (ref 0.1–1.0)
Monocytes Relative: 8.7 % (ref 3.0–12.0)
Neutro Abs: 2.6 10*3/uL (ref 1.4–7.7)
Neutrophils Relative %: 56.7 % (ref 43.0–77.0)
Platelets: 195 10*3/uL (ref 150.0–400.0)
RBC: 4.64 Mil/uL (ref 4.22–5.81)
RDW: 14.3 % (ref 11.5–15.5)
WBC: 4.5 10*3/uL (ref 4.0–10.5)

## 2021-03-06 LAB — B12 AND FOLATE PANEL
Folate: 16.4 ng/mL (ref >5.9)
Vitamin B-12: 301 pg/mL (ref 211–911)

## 2021-03-06 LAB — HEMOGLOBIN A1C: Hgb A1c MFr Bld: 6.1 % (ref 4.6–6.5)

## 2021-03-06 MED ORDER — AMLODIPINE BESYLATE 10 MG PO TABS: 10.0000 mg | ORAL_TABLET | Freq: Every day | ORAL | 1 refills | Status: DC

## 2021-03-06 MED ORDER — ATORVASTATIN CALCIUM 20 MG PO TABS: 20.0000 mg | ORAL_TABLET | Freq: Every day | ORAL | 1 refills | Status: DC

## 2021-03-06 MED ORDER — VALSARTAN 160 MG PO TABS: 160.0000 mg | ORAL_TABLET | Freq: Every day | ORAL | 1 refills | Status: DC

## 2021-03-06 NOTE — Telephone Encounter (Signed)
Released letter to [facility] for patient.

## 2021-03-06 NOTE — Progress Notes (Signed)
Kyle Marsh is a 40 y.o. male with the following history as recorded in EpicCare:  Patient Active Problem List   Diagnosis Date Noted  . Low testosterone in male 09/25/2018  . Sleep apnea 09/25/2018  . Benign hypertension 02/05/2017  . GERD 11/01/2009  . WEIGHT GAIN, ABNORMAL 11/01/2009    Current Outpatient Medications  Medication Sig Dispense Refill  . amLODipine (NORVASC) 10 MG tablet Take 1 tablet (10 mg total) by mouth daily. 90 tablet 1  . atorvastatin (LIPITOR) 20 MG tablet Take 1 tablet (20 mg total) by mouth daily. 90 tablet 1  . metFORMIN (GLUCOPHAGE) 1000 MG tablet TAKE 1 TABLET (1,000 MG TOTAL) BY MOUTH 2 (TWO) TIMES DAILY WITH A MEAL. 60 tablet 1  . Multiple Vitamin (MULTI VITAMIN MENS PO) Take by mouth.    [name] Lancets 15Q MISC USE UP TO 4 TIMES A DAY 100 each 1  . ONETOUCH ULTRA test strip USE UP TO 4 TIMES A DAY AS DIRECTED 100 strip 1  . valsartan (DIOVAN) 160 MG tablet TAKE 1 TABLET BY MOUTH EVERY DAY 30 tablet 0   No current facility-administered medications for this visit.    Allergies: Patient has no known allergies.  Past Medical History:  Diagnosis Date  . Diabetes mellitus without complication ([place])   . ED (erectile dysfunction)   . Hypertension   . Morbid obesity ([place])   . OSA on CPAP   . Varicose veins of both legs with edema     No past surgical history on file.  Family History  Problem Relation Age of Onset  . Arthritis Mother   . Diabetes Mother   . High blood pressure Mother   . Arthritis Father   . High Cholesterol Father   . High blood pressure Father   . Diabetes Maternal Grandmother   . Kidney disease Maternal Grandmother   . Cancer Paternal Grandmother   . Diabetes Paternal Grandmother   . High blood pressure Paternal Grandmother   . Arthritis Paternal Grandfather   . Diabetes Paternal Grandfather     Social History   Tobacco Use  . Smoking status: Never Smoker  . Smokeless tobacco: Never Used  Substance Use  Topics  . Alcohol use: No    Subjective:  Follow up on hypertension/ hyperlipidemia/ Type 2 Diabetes; in baseline state of health today; Denies any chest pain, shortness of breath, blurred vision or headache.    Objective:  Vitals:   03/06/21 0826  BP: 126/82  Pulse: 92  Resp: 18  Temp: (!) 97.4 F (36.3 C)  SpO2: 99%  Weight: (!) 394 lb (178.7 kg)  Height: '6\' 2"'  (1.88 m)    General: Well developed, well nourished, in no acute distress  Skin : Warm and dry.  Head: Normocephalic and atraumatic  Eyes: Sclera and conjunctiva clear; pupils round and reactive to light; extraocular movements intact  Ears: External normal; canals clear; tympanic membranes normal  Oropharynx: Pink, supple. No suspicious lesions  Neck: Supple without thyromegaly, adenopathy  Lungs: Respirations unlabored; clear to auscultation bilaterally without wheeze, rales, rhonchi  CVS exam: normal rate and regular rhythm.  Musculoskeletal: No deformities; no active joint inflammation  Extremities: No edema, cyanosis, clubbing  Vessels: Symmetric bilaterally  Neurologic: Alert and oriented; speech intact; face symmetrical; moves all extremities well; CNII-XII intact without focal deficit   Assessment:  1. Primary hypertension   2. Type 2 diabetes mellitus without complication, without long-term current use of insulin ([place])   3.  Numbness and tingling     Plan:  1. Stable; continue same medications; congratulated patient on weight loss; 2. Check CMP, Hgba1c; start Lipitor 20 mg daily;  3. Check Q68/ folic acid; may need to refer to neurology;  This visit occurred during the SARS-CoV-2 public health emergency.  Safety protocols were in place, including screening questions prior to the visit, additional usage of staff PPE, and extensive cleaning of exam room while observing appropriate contact time as indicated for disinfecting solutions.     No follow-ups on file.  Orders Placed This Encounter  Procedures   . CBC with Differential/Platelet  . Comp Met (CMET)  . Lipid panel  . Hemoglobin A1c  . B12 and Folate Panel    Requested Prescriptions   Signed Prescriptions Disp Refills  . atorvastatin (LIPITOR) 20 MG tablet 90 tablet 1    Sig: Take 1 tablet (20 mg total) by mouth daily.

## 2021-03-06 NOTE — Telephone Encounter (Signed)
Patient needs a doctor note for today. Send to [employer]

## 2021-03-08 ENCOUNTER — Encounter: Payer: Self-pay | Admitting: Family

## 2021-03-08 MED ORDER — AMLODIPINE BESYLATE 10 MG PO TABS: 10.0000 mg | ORAL_TABLET | Freq: Every day | ORAL | 1 refills | Status: DC

## 2021-03-08 MED ORDER — ATORVASTATIN CALCIUM 20 MG PO TABS: 20.0000 mg | ORAL_TABLET | Freq: Every day | ORAL | 1 refills | Status: DC

## 2021-03-08 MED ORDER — METFORMIN HCL 1000 MG PO TABS: 1000.0000 mg | ORAL_TABLET | Freq: Two times a day (BID) | ORAL | 1 refills | Status: DC

## 2021-03-08 MED ORDER — VALSARTAN 160 MG PO TABS: 160.0000 mg | ORAL_TABLET | Freq: Every day | ORAL | 1 refills | Status: DC

## 2021-03-10 ENCOUNTER — Ambulatory Visit
Admission: EM | Admit: 2021-03-10 | Discharge: 2021-03-10 | Disposition: A | Discharge status: Home / Self Care | Payer: Commercial Managed Care - PPO | Attending: Family Medicine | Admitting: Family Medicine

## 2021-03-10 ENCOUNTER — Encounter: Payer: Self-pay | Admitting: Emergency Medicine

## 2021-03-10 DIAGNOSIS — H9202 Otalgia, left ear: Secondary | ICD-10-CM

## 2021-03-10 DIAGNOSIS — R590 Localized enlarged lymph nodes: Secondary | ICD-10-CM

## 2021-03-10 DIAGNOSIS — H6592 Unspecified nonsuppurative otitis media, left ear: Secondary | ICD-10-CM | POA: Diagnosis not present

## 2021-03-10 MED ORDER — LEVOCETIRIZINE DIHYDROCHLORIDE 5 MG PO TABS: 5.0000 mg | ORAL_TABLET | Freq: Every evening | ORAL | 0 refills | Status: DC

## 2021-03-10 MED ORDER — CEFDINIR 300 MG PO CAPS: 300.0000 mg | ORAL_CAPSULE | Freq: Two times a day (BID) | ORAL | 0 refills | Status: AC

## 2021-03-10 NOTE — ED Triage Notes (Signed)
Pt is present today with left ear pain. Pt states that the ear pain started four days ago

## 2021-03-10 NOTE — ED Provider Notes (Signed)
[facility]    CSN: [id number] Arrival date & time: 03/10/21  1417      History   Chief Complaint Chief Complaint  Patient presents with  . Otalgia    left    HPI Kyle Marsh is a 40 y.o. male.   HPI  Patient with a history of type 2 diabetes, hypertension and morbid obesity presents with a left ear pain and upper jaw pain.  Patient recently recovered from a sinus infection and has had residual mild left ear pain which has progressed with left ear draining a puslike discharge overnight.  He denies any loss of hearing but has severe discomfort and tragal tenderness.  He is without fever.  He is also having pain in the upper jaw region with chewing and mouth movements as the pain is radiating into the left ear.  Denies any history of recurrent ear infections.  Past Medical History:  Diagnosis Date  . Diabetes mellitus without complication (HCC)   . ED (erectile dysfunction)   . Hypertension   . Morbid obesity ([facility])   . OSA on CPAP   . Varicose veins of both legs with edema     Patient Active Problem List   Diagnosis Date Noted  . Low testosterone in male 09/25/2018  . Sleep apnea 09/25/2018  . Benign hypertension 02/05/2017  . GERD 11/01/2009  . WEIGHT GAIN, ABNORMAL 11/01/2009    History reviewed. No pertinent surgical history.     Home Medications    Prior to Admission medications   Medication Sig Start Date End Date Taking? Authorizing Provider  cefdinir (OMNICEF) 300 MG capsule Take 1 capsule (300 mg total) by mouth 2 (two) times daily for 7 days. 03/10/21 03/17/21 Yes Bing Neighbors, FNP  levocetirizine (XYZAL) 5 MG tablet Take 1 tablet (5 mg total) by mouth every evening for 10 days. 03/10/21 03/20/21 Yes Bing Neighbors, FNP  amLODipine (NORVASC) 10 MG tablet Take 1 tablet (10 mg total) by mouth daily. 03/08/21   Olive Bass, FNP  atorvastatin (LIPITOR) 20 MG tablet Take 1 tablet (20 mg total) by mouth daily. 03/08/21   Olive Bass, FNP  metFORMIN (GLUCOPHAGE) 1000 MG tablet Take 1 tablet (1,000 mg total) by mouth 2 (two) times daily with a meal. 03/08/21   Olive Bass, FNP  Multiple Vitamin (MULTI VITAMIN MENS PO) Take by mouth.    [provider]  OneTouch Delica Lancets 33G MISC USE UP TO 4 TIMES A DAY 01/03/21   Olive Bass, FNP  [facility] ULTRA test strip USE UP TO 4 TIMES A DAY AS DIRECTED 02/10/20   Olive Bass, FNP  valsartan (DIOVAN) 160 MG tablet Take 1 tablet (160 mg total) by mouth daily. 03/08/21   Olive Bass, FNP    Family History Family History  Problem Relation Age of Onset  . Arthritis Mother   . Diabetes Mother   . High blood pressure Mother   . Arthritis Father   . High Cholesterol Father   . High blood pressure Father   . Diabetes Maternal Grandmother   . Kidney disease Maternal Grandmother   . Cancer Paternal Grandmother   . Diabetes Paternal Grandmother   . High blood pressure Paternal Grandmother   . Arthritis Paternal Grandfather   . Diabetes Paternal Grandfather     Social History Social History   Tobacco Use  . Smoking status: Never Smoker  . Smokeless tobacco: Never Used  Substance Use Topics  .  Alcohol use: No  . Drug use: No     Allergies   Patient has no known allergies.   Review of Systems Review of Systems Pertinent negatives listed in HPI  Physical Exam Triage Vital Signs ED Triage Vitals  Enc Vitals Group     BP 03/10/21 1550 135/86     Pulse Rate 03/10/21 1550 94     Resp 03/10/21 1550 18     Temp 03/10/21 1550 97.9 F (36.6 C)     Temp Source 03/10/21 1550 Oral     SpO2 03/10/21 1550 95 %     Weight --      Height --      Head Circumference --      Peak Flow --      Pain Score 03/10/21 1548 8     Pain Loc --      Pain Edu? --      Excl. in GC? --    No data found.  Updated Vital Signs BP 135/86   Pulse 94   Temp 97.9 F (36.6 C) (Oral)   Resp 18   SpO2 95%   Visual  Acuity Right Eye Distance:   Left Eye Distance:   Bilateral Distance:    Right Eye Near:   Left Eye Near:    Bilateral Near:     Physical Exam Constitutional:      Appearance: He is obese.  HENT:     Right Ear: Hearing, tympanic membrane, ear canal and external ear normal.     Left Ear: Swelling and tenderness present. A middle ear effusion is present. Tympanic membrane is erythematous and bulging.     Nose: Mucosal edema, congestion and rhinorrhea present.     Mouth/Throat:     Pharynx: Oropharynx is clear.  Cardiovascular:     Rate and Rhythm: Normal rate and regular rhythm.  Pulmonary:     Effort: Pulmonary effort is normal.     Breath sounds: Normal breath sounds and air entry.  Neurological:     Mental Status: He is alert and oriented to person, place, and time.     GCS: GCS eye subscore is 4. GCS verbal subscore is 5. GCS motor subscore is 6.  Psychiatric:        Mood and Affect: Mood normal.        Speech: Speech normal.        Behavior: Behavior normal.      UC Treatments / Results  Labs (all labs ordered are listed, but only abnormal results are displayed) Labs Reviewed - No data to display  EKG   Radiology No results found.  Procedures Procedures (including critical care time)  Medications Ordered in UC Medications - No data to display  Initial Impression / Assessment and Plan / UC Course  I have reviewed the triage vital signs and the nursing notes.  Pertinent labs & imaging results that were available during my care of the patient were reviewed by me and considered in my medical decision making (see chart for details).    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  Patient advised if any symptoms worsen or do not improve with prescribed therapy follow-up here with PCP. Final Clinical Impressions(s) / UC Diagnoses   Final diagnoses:  Left otitis media with effusion  Posterior auricular pain, left   Posterior auricular lymphadenopathy   Discharge Instructions   None    ED Prescriptions    Medication Sig Dispense Auth.  Provider   cefdinir (OMNICEF) 300 MG capsule Take 1 capsule (300 mg total) by mouth 2 (two) times daily for 7 days. 14 capsule Bing Neighbors, FNP   levocetirizine (XYZAL) 5 MG tablet Take 1 tablet (5 mg total) by mouth every evening for 10 days. 10 tablet Bing Neighbors, FNP     PDMP not reviewed this encounter.   Bing Neighbors, [place] 03/13/21 2149

## 2021-03-12 ENCOUNTER — Telehealth: Payer: Self-pay | Admitting: Neurology

## 2021-03-12 NOTE — Telephone Encounter (Signed)
Pt's wife, Anna Livers (on [place]) called, can you print off a 30-day report from the SD card? 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 Would like a call from the nurse.

## 2021-03-12 NOTE — Telephone Encounter (Signed)
My chart message sent to the pt on on this. Card can be downloaded, but will need to be brought to the office first.

## 2021-03-21 ENCOUNTER — Other Ambulatory Visit: Payer: Self-pay | Admitting: Family

## 2021-04-27 ENCOUNTER — Encounter: Payer: Self-pay | Admitting: Family

## 2021-05-21 ENCOUNTER — Other Ambulatory Visit: Payer: Self-pay

## 2021-05-21 ENCOUNTER — Ambulatory Visit
Admission: EM | Admit: 2021-05-21 | Discharge: 2021-05-21 | Disposition: A | Discharge status: Home / Self Care | Payer: Commercial Managed Care - PPO | Attending: Urgent Care | Admitting: Urgent Care

## 2021-05-21 ENCOUNTER — Encounter: Payer: Self-pay | Admitting: Emergency Medicine

## 2021-05-21 DIAGNOSIS — H9201 Otalgia, right ear: Secondary | ICD-10-CM

## 2021-05-21 DIAGNOSIS — H60391 Other infective otitis externa, right ear: Secondary | ICD-10-CM

## 2021-05-21 DIAGNOSIS — H65191 Other acute nonsuppurative otitis media, right ear: Secondary | ICD-10-CM

## 2021-05-21 DIAGNOSIS — E119 Type 2 diabetes mellitus without complications: Secondary | ICD-10-CM

## 2021-05-21 MED ORDER — NAPROXEN 500 MG PO TABS: 500.0000 mg | ORAL_TABLET | Freq: Two times a day (BID) | ORAL | 0 refills | Status: DC

## 2021-05-21 MED ORDER — CIPROFLOXACIN-DEXAMETHASONE 0.3-0.1 % OT SUSP: 4.0000 [drp] | Freq: Two times a day (BID) | OTIC | 0 refills | Status: DC

## 2021-05-21 MED ORDER — CEFDINIR 300 MG PO CAPS: 300.0000 mg | ORAL_CAPSULE | Freq: Two times a day (BID) | ORAL | 0 refills | Status: DC

## 2021-05-21 NOTE — ED Triage Notes (Signed)
Pt sts right ear pain since swimming last week

## 2021-05-21 NOTE — ED Provider Notes (Signed)
[facility]   MRN: [id number] DOB: [birth year]  Subjective:   Kyle Marsh is a 40 y.o. male presenting for 1 week history of persistent and worsening right ear pain with swelling, ear popping and ear fullness.  Pain has now started to radiate into his mouth, it makes it difficult for him to chew.  Has been using over-the-counter medications without relief.  Symptoms started after he went swimming on vacation.  He is a type II diabetic, treated without insulin.  Also takes blood pressure medications.  No current facility-administered medications for this encounter.  Current Outpatient Medications:    amLODipine (NORVASC) 10 MG tablet, Take 1 tablet (10 mg total) by mouth daily., Disp: 90 tablet, Rfl: 1   atorvastatin (LIPITOR) 20 MG tablet, Take 1 tablet (20 mg total) by mouth daily., Disp: 90 tablet, Rfl: 1   levocetirizine (XYZAL) 5 MG tablet, Take 1 tablet (5 mg total) by mouth every evening for 10 days., Disp: 10 tablet, Rfl: 0   metFORMIN (GLUCOPHAGE) 1000 MG tablet, TAKE 1 TABLET (1,000 MG TOTAL) BY MOUTH 2 (TWO) TIMES DAILY WITH A MEAL., Disp: 60 tablet, Rfl: 6   Multiple Vitamin (MULTI VITAMIN MENS PO), Take by mouth., Disp: , Rfl:    OneTouch Delica Lancets 33G MISC, USE UP TO 4 TIMES A DAY, Disp: 100 each, Rfl: 1   ONETOUCH ULTRA test strip, USE UP TO 4 TIMES A DAY AS DIRECTED, Disp: 100 strip, Rfl: 1   valsartan (DIOVAN) 160 MG tablet, Take 1 tablet (160 mg total) by mouth daily., Disp: 90 tablet, Rfl: 1   No Known Allergies  Past Medical History:  Diagnosis Date   Diabetes mellitus without complication (HCC)    ED (erectile dysfunction)    Hypertension    Morbid obesity (HCC)    OSA on CPAP    Varicose veins of both legs with edema      No past surgical history on file.  Family History  Problem Relation Age of Onset   Arthritis Mother    Diabetes Mother    High blood pressure Mother    Arthritis Father    High Cholesterol Father    High blood  pressure Father    Diabetes Maternal Grandmother    Kidney disease Maternal Grandmother    Cancer Paternal Grandmother    Diabetes Paternal Grandmother    High blood pressure Paternal Grandmother    Arthritis Paternal Grandfather    Diabetes Paternal Grandfather     Social History   Tobacco Use   Smoking status: Never   Smokeless tobacco: Never  Substance Use Topics   Alcohol use: No   Drug use: No    ROS   Objective:   Vitals: BP (!) 159/94 (BP Location: Left Arm)   Pulse 98   Temp 98.4 F (36.9 C) (Oral)   Resp 18   SpO2 95%   Physical Exam Constitutional:      General: He is not in acute distress.    Appearance: Normal appearance. He is well-developed. He is obese. He is not ill-appearing, toxic-appearing or diaphoretic.  HENT:     Head: Normocephalic and atraumatic.     Right Ear: There is no impacted cerumen.     Left Ear: Tympanic membrane, ear canal and external ear normal. There is no impacted cerumen.     Ears:     Comments: Right side erythematous and bulging TM with exquisite tenderness at the distal portion of the ear canal with swelling  and clumpy drainage.    Nose: Nose normal.     Mouth/Throat:     Pharynx: Oropharynx is clear.  Eyes:     General: No scleral icterus.       Right eye: No discharge.        Left eye: No discharge.     Extraocular Movements: Extraocular movements intact.     Pupils: Pupils are equal, round, and reactive to light.  Cardiovascular:     Rate and Rhythm: Normal rate.  Pulmonary:     Effort: Pulmonary effort is normal.  Musculoskeletal:     Cervical back: Normal range of motion.  Neurological:     Mental Status: He is alert and oriented to person, place, and time.  Psychiatric:        Mood and Affect: Mood normal.        Behavior: Behavior normal.        Thought Content: Thought content normal.        Judgment: Judgment normal.      Assessment and Plan :   PDMP not reviewed this encounter.  1. Right ear  pain   2. Infective otitis externa of right ear   3. Other non-recurrent acute nonsuppurative otitis media of right ear   4. Type 2 diabetes mellitus treated without insulin (HCC)     Will address otitis media, otitis externa with cefdinir, Ciprodex.  Recommend supportive care otherwise. Counseled patient on potential for adverse effects with medications prescribed/recommended today, ER and return-to-clinic precautions discussed, patient verbalized understanding.    Wallis Bamberg, [place] 05/21/21 [phone]

## 2021-08-29 ENCOUNTER — Encounter: Payer: Self-pay | Admitting: Family

## 2021-09-07 ENCOUNTER — Ambulatory Visit: Payer: PRIVATE HEALTH INSURANCE | Admitting: Family

## 2021-09-07 ENCOUNTER — Encounter: Payer: Self-pay | Admitting: Family Medicine

## 2021-09-07 ENCOUNTER — Other Ambulatory Visit: Payer: Self-pay

## 2021-09-07 VITALS — BP 120/60 | HR 94 | Temp 98.5°F | Ht 73.0 in | Wt 390.0 lb

## 2021-09-07 DIAGNOSIS — E119 Type 2 diabetes mellitus without complications: Secondary | ICD-10-CM | POA: Diagnosis not present

## 2021-09-07 DIAGNOSIS — Z23 Encounter for immunization: Secondary | ICD-10-CM

## 2021-09-07 DIAGNOSIS — I10 Essential (primary) hypertension: Secondary | ICD-10-CM | POA: Diagnosis not present

## 2021-09-07 LAB — COMPREHENSIVE METABOLIC PANEL
ALT: 23 U/L (ref 0–53)
AST: 20 U/L (ref 0–37)
Albumin: 4.5 g/dL (ref 3.5–5.2)
Alkaline Phosphatase: 61 U/L (ref 39–117)
BUN: 13 mg/dL (ref 6–23)
CO2: 33 mEq/L — ABNORMAL HIGH (ref 19–32)
Calcium: 9.5 mg/dL (ref 8.4–10.5)
Chloride: 98 mEq/L (ref 96–112)
Creatinine, Ser: 0.99 mg/dL (ref 0.40–1.50)
GFR: 95.81 mL/min (ref >60.00)
Glucose, Bld: 117 mg/dL — ABNORMAL HIGH (ref 70–99)
Potassium: 4.5 mEq/L (ref 3.5–5.1)
Sodium: 139 mEq/L (ref 135–145)
Total Bilirubin: 0.5 mg/dL (ref 0.2–1.2)
Total Protein: 7.8 g/dL (ref 6.0–8.3)

## 2021-09-07 LAB — HEMOGLOBIN A1C: Hgb A1c MFr Bld: 6.1 % (ref 4.6–6.5)

## 2021-09-07 MED ORDER — ATORVASTATIN CALCIUM 20 MG PO TABS: 20.0000 mg | ORAL_TABLET | Freq: Every day | ORAL | 1 refills | Status: DC

## 2021-09-07 MED ORDER — VALSARTAN 160 MG PO TABS: 160.0000 mg | ORAL_TABLET | Freq: Every day | ORAL | 1 refills | Status: DC

## 2021-09-07 MED ORDER — AMLODIPINE BESYLATE 10 MG PO TABS: 10.0000 mg | ORAL_TABLET | Freq: Every day | ORAL | 1 refills | Status: DC

## 2021-09-07 MED ORDER — METFORMIN HCL 1000 MG PO TABS: 1000.0000 mg | ORAL_TABLET | Freq: Two times a day (BID) | ORAL | 1 refills | Status: DC

## 2021-09-07 NOTE — Patient Instructions (Signed)
Regarding the Valsartan-  Please see if your pharmacist can tell you what ARB medication would be the cheapest for you. Those blood pressure medications are in a class called the ARBs and we can switch it based on what they tell us.

## 2021-09-07 NOTE — Progress Notes (Signed)
Kyle Marsh is a 40 y.o. male with the following history as recorded in EpicCare:  Patient Active Problem List   Diagnosis Date Noted   Low testosterone in male 09/25/2018   Sleep apnea 09/25/2018   Benign hypertension 02/05/2017   GERD 11/01/2009   WEIGHT GAIN, ABNORMAL 11/01/2009    Current Outpatient Medications  Medication Sig Dispense Refill   Multiple Vitamin (MULTI VITAMIN MENS PO) Take by mouth.     OneTouch Delica Lancets 82U MISC USE UP TO 4 TIMES A DAY 100 each 1   ONETOUCH ULTRA test strip USE UP TO 4 TIMES A DAY AS DIRECTED 100 strip 1   amLODipine (NORVASC) 10 MG tablet Take 1 tablet (10 mg total) by mouth daily. 90 tablet 1   atorvastatin (LIPITOR) 20 MG tablet Take 1 tablet (20 mg total) by mouth daily. 90 tablet 1   metFORMIN (GLUCOPHAGE) 1000 MG tablet Take 1 tablet (1,000 mg total) by mouth 2 (two) times daily with a meal. 180 tablet 1   valsartan (DIOVAN) 160 MG tablet Take 1 tablet (160 mg total) by mouth daily. 90 tablet 1   No current facility-administered medications for this visit.    Allergies: Patient has no known allergies.  Past Medical History:  Diagnosis Date   Diabetes mellitus without complication ([place])    ED (erectile dysfunction)    Hypertension    Morbid obesity (HCC)    OSA on CPAP    Varicose veins of both legs with edema     No past surgical history on file.  Family History  Problem Relation Age of Onset   Arthritis Mother    Diabetes Mother    High blood pressure Mother    Arthritis Father    High Cholesterol Father    High blood pressure Father    Diabetes Maternal Grandmother    Kidney disease Maternal Grandmother    Cancer Paternal Grandmother    Diabetes Paternal Grandmother    High blood pressure Paternal Grandmother    Arthritis Paternal Grandfather    Diabetes Paternal Grandfather     Social History   Tobacco Use   Smoking status: Never   Smokeless tobacco: Never  Substance Use Topics   Alcohol use: No     Subjective:   6 month follow up on hypertension/ Type 2 Diabetes; Denies any chest pain, shortness of breath, blurred vision or headache No acute concerns today; working on weight loss goals- has lost 10 pounds in the past year;     Objective:  Vitals:   09/07/21 0955  BP: 120/60  Pulse: 94  Temp: 98.5 F (36.9 C)  TempSrc: Oral  SpO2: 96%  Weight: (!) 390 lb (176.9 kg)  Height: '6\' 1"'  (1.854 m)    General: Well developed, well nourished, in no acute distress  Skin : Warm and dry.  Head: Normocephalic and atraumatic  Eyes: Sclera and conjunctiva clear; pupils round and reactive to light; extraocular movements intact  Ears: External normal; canals clear; tympanic membranes normal  Oropharynx: Pink, supple. No suspicious lesions  Neck: Supple without thyromegaly, adenopathy  Lungs: Respirations unlabored; clear to auscultation bilaterally without wheeze, rales, rhonchi  CVS exam: normal rate and regular rhythm.  Neurologic: Alert and oriented; speech intact; face symmetrical; moves all extremities well; CNII-XII intact without focal deficit   Assessment:  1. Controlled type 2 diabetes mellitus without complication, without long-term current use of insulin ([place])   2. Primary hypertension   3. Need for immunization against  influenza     Plan:  Update labs; refill updated; continue working on weight loss goal;  Stable; refills updated;  Flu shot updated;  This visit occurred during the SARS-CoV-2 public health emergency.  Safety protocols were in place, including screening questions prior to the visit, additional usage of staff PPE, and extensive cleaning of exam room while observing appropriate contact time as indicated for disinfecting solutions.    No follow-ups on file.  Orders Placed This Encounter  Procedures   Flu Vaccine QUAD 71moIM (Fluarix, Fluzone & Alfiuria Quad PF)   Comp Met (CMET)   Hemoglobin A1c    Requested Prescriptions   Signed Prescriptions Disp  Refills   amLODipine (NORVASC) 10 MG tablet 90 tablet 1    Sig: Take 1 tablet (10 mg total) by mouth daily.   atorvastatin (LIPITOR) 20 MG tablet 90 tablet 1    Sig: Take 1 tablet (20 mg total) by mouth daily.   metFORMIN (GLUCOPHAGE) 1000 MG tablet 180 tablet 1    Sig: Take 1 tablet (1,000 mg total) by mouth 2 (two) times daily with a meal.   valsartan (DIOVAN) 160 MG tablet 90 tablet 1    Sig: Take 1 tablet (160 mg total) by mouth daily.

## 2021-12-03 ENCOUNTER — Encounter: Payer: Self-pay | Admitting: Family

## 2022-01-23 ENCOUNTER — Encounter: Payer: Self-pay | Admitting: Family

## 2022-03-22 ENCOUNTER — Encounter: Payer: Self-pay | Admitting: Family

## 2022-03-22 ENCOUNTER — Ambulatory Visit (INDEPENDENT_AMBULATORY_CARE_PROVIDER_SITE_OTHER): Payer: PRIVATE HEALTH INSURANCE | Admitting: Family

## 2022-03-22 ENCOUNTER — Other Ambulatory Visit: Payer: Self-pay | Admitting: Family

## 2022-03-22 VITALS — BP 111/64 | HR 85 | Ht 73.0 in | Wt 398.6 lb

## 2022-03-22 DIAGNOSIS — Z13220 Encounter for screening for lipoid disorders: Secondary | ICD-10-CM | POA: Diagnosis not present

## 2022-03-22 DIAGNOSIS — Z0000 Encounter for general adult medical examination without abnormal findings: Secondary | ICD-10-CM | POA: Diagnosis not present

## 2022-03-22 DIAGNOSIS — Z125 Encounter for screening for malignant neoplasm of prostate: Secondary | ICD-10-CM

## 2022-03-22 DIAGNOSIS — E538 Deficiency of other specified B group vitamins: Secondary | ICD-10-CM | POA: Diagnosis not present

## 2022-03-22 DIAGNOSIS — E119 Type 2 diabetes mellitus without complications: Secondary | ICD-10-CM

## 2022-03-22 DIAGNOSIS — E66813 Obesity, class 3: Secondary | ICD-10-CM | POA: Insufficient documentation

## 2022-03-22 LAB — COMPREHENSIVE METABOLIC PANEL
ALT: 28 U/L (ref 0–53)
AST: 25 U/L (ref 0–37)
Albumin: 4.4 g/dL (ref 3.5–5.2)
Alkaline Phosphatase: 55 U/L (ref 39–117)
BUN: 11 mg/dL (ref 6–23)
CO2: 34 mEq/L — ABNORMAL HIGH (ref 19–32)
Calcium: 9.5 mg/dL (ref 8.4–10.5)
Chloride: 100 mEq/L (ref 96–112)
Creatinine, Ser: 0.99 mg/dL (ref 0.40–1.50)
GFR: 95.45 mL/min (ref >60.00)
Glucose, Bld: 121 mg/dL — ABNORMAL HIGH (ref 70–99)
Potassium: 4.3 mEq/L (ref 3.5–5.1)
Sodium: 139 mEq/L (ref 135–145)
Total Bilirubin: 0.6 mg/dL (ref 0.2–1.2)
Total Protein: 7.7 g/dL (ref 6.0–8.3)

## 2022-03-22 LAB — LIPID PANEL
Cholesterol: 105 mg/dL (ref 0–200)
HDL: 40.1 mg/dL (ref >39.00)
LDL Cholesterol: 48 mg/dL (ref 0–99)
NonHDL: 64.59
Total CHOL/HDL Ratio: 3
Triglycerides: 85 mg/dL (ref 0.0–149.0)
VLDL: 17 mg/dL (ref 0.0–40.0)

## 2022-03-22 LAB — CBC WITH DIFFERENTIAL/PLATELET
Basophils Absolute: 0 10*3/uL (ref 0.0–0.1)
Basophils Relative: 1 % (ref 0.0–3.0)
Eosinophils Absolute: 0.1 10*3/uL (ref 0.0–0.7)
Eosinophils Relative: 1.5 % (ref 0.0–5.0)
HCT: 40 % (ref 39.0–52.0)
Hemoglobin: 12.9 g/dL — ABNORMAL LOW (ref 13.0–17.0)
Lymphocytes Relative: 30.4 % (ref 12.0–46.0)
Lymphs Abs: 1.5 10*3/uL (ref 0.7–4.0)
MCHC: 32.2 g/dL (ref 30.0–36.0)
MCV: 88.9 fl (ref 78.0–100.0)
Monocytes Absolute: 0.5 10*3/uL (ref 0.1–1.0)
Monocytes Relative: 9.4 % (ref 3.0–12.0)
Neutro Abs: 2.8 10*3/uL (ref 1.4–7.7)
Neutrophils Relative %: 57.7 % (ref 43.0–77.0)
Platelets: 193 10*3/uL (ref 150.0–400.0)
RBC: 4.5 Mil/uL (ref 4.22–5.81)
RDW: 14.4 % (ref 11.5–15.5)
WBC: 4.8 10*3/uL (ref 4.0–10.5)

## 2022-03-22 LAB — PSA: PSA: 0.28 ng/mL (ref 0.10–4.00)

## 2022-03-22 LAB — HEMOGLOBIN A1C: Hgb A1c MFr Bld: 6.3 % (ref 4.6–6.5)

## 2022-03-22 LAB — VITAMIN B12: Vitamin B-12: 397 pg/mL (ref 211–911)

## 2022-03-22 MED ORDER — ATORVASTATIN CALCIUM 20 MG PO TABS: 20.0000 mg | ORAL_TABLET | Freq: Every day | ORAL | 1 refills | Status: DC

## 2022-03-22 MED ORDER — TIRZEPATIDE 2.5 MG/0.5ML ~~LOC~~ SOAJ: 2.5000 mg | SUBCUTANEOUS | 0 refills | Status: DC

## 2022-03-22 MED ORDER — AMLODIPINE BESYLATE 10 MG PO TABS: 10.0000 mg | ORAL_TABLET | Freq: Every day | ORAL | 1 refills | Status: DC

## 2022-03-22 MED ORDER — VALSARTAN 160 MG PO TABS: 160.0000 mg | ORAL_TABLET | Freq: Every day | ORAL | 1 refills | Status: DC

## 2022-03-22 MED ORDER — TERBINAFINE HCL 250 MG PO TABS: 250.0000 mg | ORAL_TABLET | Freq: Every day | ORAL | 1 refills | Status: DC

## 2022-03-22 NOTE — Progress Notes (Signed)
Kyle Marsh is a 41 y.o. male with the following history as recorded in EpicCare:  Patient Active Problem List   Diagnosis Date Noted   Obesity, morbid, BMI 50 or higher ([place]) 03/22/2022   Low testosterone in male 09/25/2018   Sleep apnea 09/25/2018   Benign hypertension 02/05/2017   GERD 11/01/2009   WEIGHT GAIN, ABNORMAL 11/01/2009    Current Outpatient Medications  Medication Sig Dispense Refill   metFORMIN (GLUCOPHAGE) 1000 MG tablet Take 1 tablet (1,000 mg total) by mouth 2 (two) times daily with a meal. 180 tablet 1   Multiple Vitamin (MULTI VITAMIN MENS PO) Take by mouth.     OneTouch Delica Lancets 26V MISC USE UP TO 4 TIMES A DAY 100 each 1   ONETOUCH ULTRA test strip USE UP TO 4 TIMES A DAY AS DIRECTED 100 strip 1   amLODipine (NORVASC) 10 MG tablet Take 1 tablet (10 mg total) by mouth daily. 90 tablet 1   atorvastatin (LIPITOR) 20 MG tablet Take 1 tablet (20 mg total) by mouth daily. 90 tablet 1   valsartan (DIOVAN) 160 MG tablet Take 1 tablet (160 mg total) by mouth daily. 90 tablet 1   No current facility-administered medications for this visit.    Allergies: Patient has no known allergies.  Past Medical History:  Diagnosis Date   Diabetes mellitus without complication ([place])    ED (erectile dysfunction)    Hypertension    Morbid obesity (H[facility])    OSA on CPAP    Varicose veins of both legs with edema     No past surgical history on file.  Family History  Problem Relation Age of Onset   Arthritis Mother    Diabetes Mother    High blood pressure Mother    Arthritis Father    High Cholesterol Father    High blood pressure Father    Diabetes Maternal Grandmother    Kidney disease Maternal Grandmother    Cancer Paternal Grandmother    Diabetes Paternal Grandmother    High blood pressure Paternal Grandmother    Arthritis Paternal Grandfather    Diabetes Paternal Grandfather     Social History   Tobacco Use   Smoking status: Never   Smokeless tobacco:  Never  Substance Use Topics   Alcohol use: No    Subjective:  Presents for yearly CPE; would like to discuss weight loss options; also concerned about worsening thickening/ discoloration of toenails on both feet;  Overdue for eye exam; up to date with dentist; has been doing much better since getting CPAP/ sleep apnea treated;   Review of Systems  Constitutional: Negative.   HENT: Negative.    Eyes: Negative.   Respiratory: Negative.    Cardiovascular: Negative.   Gastrointestinal: Negative.   Genitourinary: Negative.   Musculoskeletal: Negative.   Skin: Negative.   Neurological: Negative.   Endo/Heme/Allergies: Negative.   Psychiatric/Behavioral: Negative.        Objective:  Vitals:   03/22/22 0930  BP: 111/64  Pulse: 85  Weight: (!) 398 lb 9.6 oz (180.8 kg)  Height: '6\' 1"'  (1.854 m)    General: Well developed, well nourished, in no acute distress  Skin : Warm and dry.  Head: Normocephalic and atraumatic  Eyes: Sclera and conjunctiva clear; pupils round and reactive to light; extraocular movements intact  Ears: External normal; canals clear; tympanic membranes normal  Oropharynx: Pink, supple. No suspicious lesions  Neck: Supple without thyromegaly, adenopathy  Lungs: Respirations unlabored; clear to auscultation  bilaterally without wheeze, rales, rhonchi  CVS exam: normal rate and regular rhythm.  Abdomen: Soft; nontender; nondistended; normoactive bowel sounds; no masses or hepatosplenomegaly  Musculoskeletal: No deformities; no active joint inflammation  Extremities: No edema, cyanosis, clubbing; thickened toenails; foot exam otherwise unremarkable; Vessels: Symmetric bilaterally  Neurologic: Alert and oriented; speech intact; face symmetrical; moves all extremities well; CNII-XII intact without focal deficit   Assessment:  1. PE (physical exam), annual   2. Obesity, morbid, BMI 50 or higher ([place])   3. Lipid screening   4. Type 2 diabetes mellitus without  complication, without long-term current use of insulin ([place])   5. Prostate cancer screening   6. Low vitamin B12 level     Plan:  Age appropriate preventive healthcare needs addressed; encouraged regular eye doctor and dental exams; encouraged regular exercise; will update labs and refills as needed today; follow-up to be determined; Assuming labs are normal, will start Lamisil to treat toenail infection;  Will also consider Mounjaro- patient is given information about medication and will discuss with his wife as well; will discuss prescription once labs are back;    No follow-ups on file.  Orders Placed This Encounter  Procedures   CBC with Differential/Platelet   Comp Met (CMET)   Lipid panel   Hemoglobin A1c   PSA   B12    Requested Prescriptions   Signed Prescriptions Disp Refills   valsartan (DIOVAN) 160 MG tablet 90 tablet 1    Sig: Take 1 tablet (160 mg total) by mouth daily.   atorvastatin (LIPITOR) 20 MG tablet 90 tablet 1    Sig: Take 1 tablet (20 mg total) by mouth daily.   amLODipine (NORVASC) 10 MG tablet 90 tablet 1    Sig: Take 1 tablet (10 mg total) by mouth daily.    /

## 2022-03-25 ENCOUNTER — Telehealth: Payer: Self-pay

## 2022-03-25 NOTE — Telephone Encounter (Signed)
Pt would like to know if he should stop the metformin since he going to be taking the mounjaro.

## 2022-03-26 NOTE — Telephone Encounter (Signed)
Patient will d/c metformin.  Appointment set up 2 months.

## 2022-04-20 ENCOUNTER — Encounter: Payer: Self-pay | Admitting: Family

## 2022-04-22 ENCOUNTER — Emergency Department (HOSPITAL_COMMUNITY)
Admission: EM | Admit: 2022-04-22 | Discharge: 2022-04-23 | Disposition: A | Discharge status: Home / Self Care | Payer: PRIVATE HEALTH INSURANCE | Attending: Emergency Medicine | Admitting: Emergency Medicine

## 2022-04-22 ENCOUNTER — Encounter (HOSPITAL_COMMUNITY): Payer: Self-pay

## 2022-04-22 ENCOUNTER — Other Ambulatory Visit: Payer: Self-pay

## 2022-04-22 DIAGNOSIS — Y92410 Unspecified street and highway as the place of occurrence of the external cause: Secondary | ICD-10-CM | POA: Insufficient documentation

## 2022-04-22 DIAGNOSIS — M542 Cervicalgia: Secondary | ICD-10-CM | POA: Diagnosis not present

## 2022-04-22 DIAGNOSIS — I10 Essential (primary) hypertension: Secondary | ICD-10-CM | POA: Diagnosis not present

## 2022-04-22 DIAGNOSIS — M25562 Pain in left knee: Secondary | ICD-10-CM | POA: Diagnosis not present

## 2022-04-22 DIAGNOSIS — R0789 Other chest pain: Secondary | ICD-10-CM | POA: Diagnosis not present

## 2022-04-22 DIAGNOSIS — M545 Low back pain, unspecified: Secondary | ICD-10-CM | POA: Diagnosis not present

## 2022-04-22 DIAGNOSIS — M549 Dorsalgia, unspecified: Secondary | ICD-10-CM | POA: Diagnosis present

## 2022-04-22 DIAGNOSIS — Z79899 Other long term (current) drug therapy: Secondary | ICD-10-CM | POA: Diagnosis not present

## 2022-04-22 MED ORDER — ACETAMINOPHEN 325 MG PO TABS: 650.0000 mg | ORAL_TABLET | Freq: Four times a day (QID) | ORAL | Status: DC | PRN

## 2022-04-22 MED ORDER — ACETAMINOPHEN 500 MG PO TABS
1000.0000 mg | ORAL_TABLET | Freq: Four times a day (QID) | ORAL | Status: DC | PRN
  Administered 2022-04-22: 1000 mg via ORAL
  Filled 2022-04-22: qty 2
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
## 2022-04-23 ENCOUNTER — Emergency Department (HOSPITAL_COMMUNITY): Payer: PRIVATE HEALTH INSURANCE

## 2022-04-23 MED ORDER — NAPROXEN 375 MG PO TABS
375.0000 mg | ORAL_TABLET | Freq: Two times a day (BID) | ORAL | 0 refills | Status: DC
Start: 2022-04-23 — End: 2022-04-26

## 2022-04-23 MED ORDER — METHOCARBAMOL 500 MG PO TABS: 500.0000 mg | ORAL_TABLET | Freq: Two times a day (BID) | ORAL | 0 refills | Status: DC

## 2022-04-23 NOTE — Discharge Instructions (Signed)
X-rays today were all negative.  You will likely be sore for a few days which is normal. Take the prescribed medication as directed. Follow-up with your primary care doctor. Return to the ED for new or worsening symptoms.
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
## 2022-04-24 ENCOUNTER — Other Ambulatory Visit: Payer: Self-pay | Admitting: Family

## 2022-04-24 MED ORDER — TIRZEPATIDE 5 MG/0.5ML ~~LOC~~ SOAJ: 5.0000 mg | SUBCUTANEOUS | 0 refills | Status: DC

## 2022-04-25 ENCOUNTER — Other Ambulatory Visit: Payer: Self-pay | Admitting: Family

## 2022-04-25 ENCOUNTER — Telehealth: Payer: Self-pay | Admitting: *Deleted

## 2022-04-25 NOTE — Telephone Encounter (Signed)
This request has been approved using information available on the patient's profile. [id number];Review Type:Prior Auth;Coverage Start Date:04/20/2022;Coverage End Date:05/20/2023;

## 2022-04-25 NOTE — Telephone Encounter (Signed)
Prior auth started via cover my meds.  Awaiting determination.  Key: [id number]

## 2022-04-26 ENCOUNTER — Ambulatory Visit: Payer: PRIVATE HEALTH INSURANCE | Admitting: Family

## 2022-04-26 ENCOUNTER — Other Ambulatory Visit: Payer: Self-pay | Admitting: Family

## 2022-04-26 VITALS — BP 128/82 | HR 92 | Temp 98.0°F | Resp 16 | Ht 73.0 in | Wt 394.0 lb

## 2022-04-26 DIAGNOSIS — M25562 Pain in left knee: Secondary | ICD-10-CM

## 2022-04-26 DIAGNOSIS — M545 Low back pain, unspecified: Secondary | ICD-10-CM

## 2022-04-26 MED ORDER — MELOXICAM 15 MG PO TABS: 15.0000 mg | ORAL_TABLET | Freq: Every day | ORAL | 0 refills | Status: DC
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
## 2022-04-30 ENCOUNTER — Ambulatory Visit (INDEPENDENT_AMBULATORY_CARE_PROVIDER_SITE_OTHER): Payer: PRIVATE HEALTH INSURANCE | Admitting: Surgery

## 2022-04-30 ENCOUNTER — Encounter: Payer: Self-pay | Admitting: Surgery

## 2022-04-30 DIAGNOSIS — M7062 Trochanteric bursitis, left hip: Secondary | ICD-10-CM

## 2022-04-30 DIAGNOSIS — M7052 Other bursitis of knee, left knee: Secondary | ICD-10-CM

## 2022-04-30 DIAGNOSIS — M5416 Radiculopathy, lumbar region: Secondary | ICD-10-CM | POA: Diagnosis not present

## 2022-04-30 DIAGNOSIS — S8002XA Contusion of left knee, initial encounter: Secondary | ICD-10-CM | POA: Diagnosis not present
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
## 2022-05-02 ENCOUNTER — Encounter: Payer: Self-pay | Admitting: Family

## 2022-05-06 ENCOUNTER — Telehealth: Payer: Self-pay | Admitting: Surgery

## 2022-05-06 NOTE — Telephone Encounter (Signed)
FMLA forms received via mychart. To [employer]. Patient aware of [employer] protocol.

## 2022-05-07 ENCOUNTER — Encounter: Payer: Self-pay | Admitting: Family

## 2022-05-07 ENCOUNTER — Other Ambulatory Visit: Payer: Self-pay | Admitting: Family

## 2022-05-07 MED ORDER — AMLODIPINE BESYLATE 10 MG PO TABS: 10.0000 mg | ORAL_TABLET | Freq: Every day | ORAL | 1 refills | Status: DC

## 2022-05-10 ENCOUNTER — Ambulatory Visit (INDEPENDENT_AMBULATORY_CARE_PROVIDER_SITE_OTHER): Payer: PRIVATE HEALTH INSURANCE | Admitting: Rehabilitative and Restorative Service Providers"

## 2022-05-10 ENCOUNTER — Encounter: Payer: Self-pay | Admitting: Rehabilitative and Restorative Service Providers"

## 2022-05-10 DIAGNOSIS — M25562 Pain in left knee: Secondary | ICD-10-CM

## 2022-05-10 DIAGNOSIS — M25552 Pain in left hip: Secondary | ICD-10-CM | POA: Diagnosis not present

## 2022-05-10 DIAGNOSIS — M5459 Other low back pain: Secondary | ICD-10-CM | POA: Diagnosis not present

## 2022-05-10 DIAGNOSIS — R262 Difficulty in walking, not elsewhere classified: Secondary | ICD-10-CM | POA: Diagnosis not present

## 2022-05-10 DIAGNOSIS — R600 Localized edema: Secondary | ICD-10-CM

## 2022-05-10 DIAGNOSIS — R293 Abnormal posture: Secondary | ICD-10-CM | POA: Diagnosis not present
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
## 2022-05-15 ENCOUNTER — Encounter: Payer: Self-pay | Admitting: Surgery

## 2022-05-15 ENCOUNTER — Telehealth: Payer: Self-pay | Admitting: Surgery

## 2022-05-15 ENCOUNTER — Ambulatory Visit (INDEPENDENT_AMBULATORY_CARE_PROVIDER_SITE_OTHER): Payer: PRIVATE HEALTH INSURANCE | Admitting: Surgery

## 2022-05-15 DIAGNOSIS — M5416 Radiculopathy, lumbar region: Secondary | ICD-10-CM

## 2022-05-15 DIAGNOSIS — M7052 Other bursitis of knee, left knee: Secondary | ICD-10-CM | POA: Diagnosis not present

## 2022-05-15 MED ORDER — METHYLPREDNISOLONE ACETATE 40 MG/ML IJ SUSP
40.0000 mg | INTRAMUSCULAR | Status: AC | PRN
  Administered 2022-05-15: 40 mg via INTRA_ARTICULAR

## 2022-05-15 MED ORDER — BUPIVACAINE HCL 0.5 % IJ SOLN
2.0000 mL | INTRAMUSCULAR | Status: AC | PRN
  Administered 2022-05-15: 2 mL via INTRA_ARTICULAR
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
## 2022-05-15 NOTE — Telephone Encounter (Signed)
Patient dropped off letter for [employer]  /Forwarding to [place] today

## 2022-05-17 ENCOUNTER — Ambulatory Visit (INDEPENDENT_AMBULATORY_CARE_PROVIDER_SITE_OTHER): Payer: PRIVATE HEALTH INSURANCE | Admitting: Physical Therapy

## 2022-05-17 ENCOUNTER — Encounter: Payer: Self-pay | Admitting: Physical Therapy

## 2022-05-17 DIAGNOSIS — R262 Difficulty in walking, not elsewhere classified: Secondary | ICD-10-CM | POA: Diagnosis not present

## 2022-05-17 DIAGNOSIS — M5459 Other low back pain: Secondary | ICD-10-CM

## 2022-05-17 DIAGNOSIS — R293 Abnormal posture: Secondary | ICD-10-CM | POA: Diagnosis not present

## 2022-05-17 DIAGNOSIS — M25552 Pain in left hip: Secondary | ICD-10-CM

## 2022-05-17 DIAGNOSIS — M25562 Pain in left knee: Secondary | ICD-10-CM

## 2022-05-17 DIAGNOSIS — R600 Localized edema: Secondary | ICD-10-CM
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
## 2022-05-21 ENCOUNTER — Encounter: Payer: Self-pay | Admitting: Physical Therapy

## 2022-05-21 ENCOUNTER — Ambulatory Visit (INDEPENDENT_AMBULATORY_CARE_PROVIDER_SITE_OTHER): Payer: PRIVATE HEALTH INSURANCE | Admitting: Physical Therapy

## 2022-05-21 DIAGNOSIS — R293 Abnormal posture: Secondary | ICD-10-CM | POA: Diagnosis not present

## 2022-05-21 DIAGNOSIS — R262 Difficulty in walking, not elsewhere classified: Secondary | ICD-10-CM

## 2022-05-21 DIAGNOSIS — M25562 Pain in left knee: Secondary | ICD-10-CM

## 2022-05-21 DIAGNOSIS — M5459 Other low back pain: Secondary | ICD-10-CM

## 2022-05-21 DIAGNOSIS — M25552 Pain in left hip: Secondary | ICD-10-CM

## 2022-05-21 DIAGNOSIS — R600 Localized edema: Secondary | ICD-10-CM
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
## 2022-05-23 ENCOUNTER — Encounter: Payer: Self-pay | Admitting: Rehabilitative and Restorative Service Providers"

## 2022-05-23 ENCOUNTER — Ambulatory Visit (INDEPENDENT_AMBULATORY_CARE_PROVIDER_SITE_OTHER): Payer: PRIVATE HEALTH INSURANCE | Admitting: Rehabilitative and Restorative Service Providers"

## 2022-05-23 DIAGNOSIS — M5459 Other low back pain: Secondary | ICD-10-CM | POA: Diagnosis not present

## 2022-05-23 DIAGNOSIS — R262 Difficulty in walking, not elsewhere classified: Secondary | ICD-10-CM | POA: Diagnosis not present

## 2022-05-23 DIAGNOSIS — M25562 Pain in left knee: Secondary | ICD-10-CM

## 2022-05-23 DIAGNOSIS — M25552 Pain in left hip: Secondary | ICD-10-CM

## 2022-05-23 DIAGNOSIS — R293 Abnormal posture: Secondary | ICD-10-CM

## 2022-05-23 DIAGNOSIS — R600 Localized edema: Secondary | ICD-10-CM
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
## 2022-05-24 ENCOUNTER — Ambulatory Visit: Payer: PRIVATE HEALTH INSURANCE | Admitting: Family

## 2022-05-24 VITALS — BP 120/80 | HR 80 | Temp 97.8°F | Resp 18 | Ht 73.0 in | Wt 383.0 lb

## 2022-05-24 DIAGNOSIS — S30865A Insect bite (nonvenomous) of unspecified external genital organs, male, initial encounter: Secondary | ICD-10-CM | POA: Diagnosis not present

## 2022-05-24 DIAGNOSIS — W57XXXA Bitten or stung by nonvenomous insect and other nonvenomous arthropods, initial encounter: Secondary | ICD-10-CM | POA: Diagnosis not present

## 2022-05-24 DIAGNOSIS — B351 Tinea unguium: Secondary | ICD-10-CM

## 2022-05-24 DIAGNOSIS — E119 Type 2 diabetes mellitus without complications: Secondary | ICD-10-CM

## 2022-05-24 DIAGNOSIS — M545 Low back pain, unspecified: Secondary | ICD-10-CM

## 2022-05-24 LAB — COMPREHENSIVE METABOLIC PANEL
ALT: 25 U/L (ref 0–53)
AST: 18 U/L (ref 0–37)
Albumin: 4.7 g/dL (ref 3.5–5.2)
Alkaline Phosphatase: 68 U/L (ref 39–117)
BUN: 11 mg/dL (ref 6–23)
CO2: 34 mEq/L — ABNORMAL HIGH (ref 19–32)
Calcium: 9.7 mg/dL (ref 8.4–10.5)
Chloride: 100 mEq/L (ref 96–112)
Creatinine, Ser: 1.04 mg/dL (ref 0.40–1.50)
GFR: 89.86 mL/min (ref >60.00)
Glucose, Bld: 102 mg/dL — ABNORMAL HIGH (ref 70–99)
Potassium: 4.4 mEq/L (ref 3.5–5.1)
Sodium: 139 mEq/L (ref 135–145)
Total Bilirubin: 0.5 mg/dL (ref 0.2–1.2)
Total Protein: 8.1 g/dL (ref 6.0–8.3)

## 2022-05-24 LAB — HEMOGLOBIN A1C: Hgb A1c MFr Bld: 5.8 % (ref 4.6–6.5)

## 2022-05-24 MED ORDER — TERBINAFINE HCL 250 MG PO TABS
250.0000 mg | ORAL_TABLET | Freq: Every day | ORAL | 0 refills | Status: DC
Start: 2022-05-24 — End: 2023-03-25

## 2022-05-24 MED ORDER — METHOCARBAMOL 500 MG PO TABS: 500.0000 mg | ORAL_TABLET | Freq: Two times a day (BID) | ORAL | 0 refills | Status: DC

## 2022-05-24 MED ORDER — DOXYCYCLINE HYCLATE 100 MG PO TABS: ORAL_TABLET | ORAL | 0 refills | Status: DC

## 2022-05-24 MED ORDER — TIRZEPATIDE 7.5 MG/0.5ML ~~LOC~~ SOAJ: 7.5000 mg | SUBCUTANEOUS | 1 refills | Status: DC
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
## 2022-05-26 ENCOUNTER — Other Ambulatory Visit: Payer: Self-pay | Admitting: Family

## 2022-05-27 NOTE — Telephone Encounter (Signed)
Mounjaro on back order, please advise.

## 2022-05-28 ENCOUNTER — Encounter: Payer: Self-pay | Admitting: Family

## 2022-05-28 ENCOUNTER — Ambulatory Visit (INDEPENDENT_AMBULATORY_CARE_PROVIDER_SITE_OTHER): Payer: PRIVATE HEALTH INSURANCE | Admitting: Physical Therapy

## 2022-05-28 ENCOUNTER — Encounter: Payer: Self-pay | Admitting: Physical Therapy

## 2022-05-28 DIAGNOSIS — M25552 Pain in left hip: Secondary | ICD-10-CM

## 2022-05-28 DIAGNOSIS — M5459 Other low back pain: Secondary | ICD-10-CM

## 2022-05-28 DIAGNOSIS — R262 Difficulty in walking, not elsewhere classified: Secondary | ICD-10-CM | POA: Diagnosis not present

## 2022-05-28 DIAGNOSIS — R293 Abnormal posture: Secondary | ICD-10-CM | POA: Diagnosis not present

## 2022-05-28 DIAGNOSIS — R600 Localized edema: Secondary | ICD-10-CM

## 2022-05-28 DIAGNOSIS — M25562 Pain in left knee: Secondary | ICD-10-CM
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
## 2022-05-29 ENCOUNTER — Other Ambulatory Visit: Payer: Self-pay

## 2022-05-29 ENCOUNTER — Encounter (INDEPENDENT_AMBULATORY_CARE_PROVIDER_SITE_OTHER): Payer: Self-pay

## 2022-05-30 ENCOUNTER — Encounter: Payer: Self-pay | Admitting: Rehabilitative and Restorative Service Providers"

## 2022-05-30 ENCOUNTER — Ambulatory Visit (INDEPENDENT_AMBULATORY_CARE_PROVIDER_SITE_OTHER): Payer: PRIVATE HEALTH INSURANCE | Admitting: Surgery

## 2022-05-30 ENCOUNTER — Ambulatory Visit (INDEPENDENT_AMBULATORY_CARE_PROVIDER_SITE_OTHER): Payer: PRIVATE HEALTH INSURANCE | Admitting: Rehabilitative and Restorative Service Providers"

## 2022-05-30 ENCOUNTER — Encounter: Payer: Self-pay | Admitting: Surgery

## 2022-05-30 DIAGNOSIS — M25562 Pain in left knee: Secondary | ICD-10-CM

## 2022-05-30 DIAGNOSIS — R293 Abnormal posture: Secondary | ICD-10-CM | POA: Diagnosis not present

## 2022-05-30 DIAGNOSIS — R262 Difficulty in walking, not elsewhere classified: Secondary | ICD-10-CM

## 2022-05-30 DIAGNOSIS — M5416 Radiculopathy, lumbar region: Secondary | ICD-10-CM

## 2022-05-30 DIAGNOSIS — M25552 Pain in left hip: Secondary | ICD-10-CM | POA: Diagnosis not present

## 2022-05-30 DIAGNOSIS — M5459 Other low back pain: Secondary | ICD-10-CM

## 2022-05-30 DIAGNOSIS — R600 Localized edema: Secondary | ICD-10-CM
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
## 2022-05-30 NOTE — Telephone Encounter (Signed)
I have attempted to call pt and there was no answer. Left a message to call back.
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
## 2022-06-04 ENCOUNTER — Encounter: Payer: Self-pay | Admitting: Physical Therapy

## 2022-06-04 ENCOUNTER — Ambulatory Visit (INDEPENDENT_AMBULATORY_CARE_PROVIDER_SITE_OTHER): Payer: PRIVATE HEALTH INSURANCE | Admitting: Physical Therapy

## 2022-06-04 DIAGNOSIS — M5459 Other low back pain: Secondary | ICD-10-CM

## 2022-06-04 DIAGNOSIS — M25552 Pain in left hip: Secondary | ICD-10-CM | POA: Diagnosis not present

## 2022-06-04 DIAGNOSIS — R262 Difficulty in walking, not elsewhere classified: Secondary | ICD-10-CM | POA: Diagnosis not present

## 2022-06-04 DIAGNOSIS — M25562 Pain in left knee: Secondary | ICD-10-CM

## 2022-06-04 DIAGNOSIS — R293 Abnormal posture: Secondary | ICD-10-CM

## 2022-06-04 DIAGNOSIS — R600 Localized edema: Secondary | ICD-10-CM
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
## 2022-06-06 ENCOUNTER — Encounter: Payer: Self-pay | Admitting: Rehabilitative and Restorative Service Providers"

## 2022-06-06 ENCOUNTER — Ambulatory Visit (INDEPENDENT_AMBULATORY_CARE_PROVIDER_SITE_OTHER): Payer: PRIVATE HEALTH INSURANCE | Admitting: Rehabilitative and Restorative Service Providers"

## 2022-06-06 DIAGNOSIS — R262 Difficulty in walking, not elsewhere classified: Secondary | ICD-10-CM | POA: Diagnosis not present

## 2022-06-06 DIAGNOSIS — R293 Abnormal posture: Secondary | ICD-10-CM | POA: Diagnosis not present

## 2022-06-06 DIAGNOSIS — M5459 Other low back pain: Secondary | ICD-10-CM
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
## 2022-06-10 ENCOUNTER — Ambulatory Visit (INDEPENDENT_AMBULATORY_CARE_PROVIDER_SITE_OTHER): Payer: PRIVATE HEALTH INSURANCE | Admitting: Physical Therapy

## 2022-06-10 ENCOUNTER — Encounter: Payer: Self-pay | Admitting: Family

## 2022-06-10 ENCOUNTER — Encounter: Payer: Self-pay | Admitting: Physical Therapy

## 2022-06-10 DIAGNOSIS — M25562 Pain in left knee: Secondary | ICD-10-CM

## 2022-06-10 DIAGNOSIS — R262 Difficulty in walking, not elsewhere classified: Secondary | ICD-10-CM

## 2022-06-10 DIAGNOSIS — R293 Abnormal posture: Secondary | ICD-10-CM

## 2022-06-10 DIAGNOSIS — M5459 Other low back pain: Secondary | ICD-10-CM | POA: Diagnosis not present

## 2022-06-10 DIAGNOSIS — M25552 Pain in left hip: Secondary | ICD-10-CM

## 2022-06-10 DIAGNOSIS — R600 Localized edema: Secondary | ICD-10-CM
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
## 2022-06-11 NOTE — Telephone Encounter (Signed)
Pt called back to see if there was any way they could see pcp today. Advised there were no openings, but offered urgent care or mychart on demand visits as well.

## 2022-06-13 ENCOUNTER — Ambulatory Visit (INDEPENDENT_AMBULATORY_CARE_PROVIDER_SITE_OTHER): Payer: PRIVATE HEALTH INSURANCE | Admitting: Physical Therapy

## 2022-06-13 DIAGNOSIS — M5459 Other low back pain: Secondary | ICD-10-CM

## 2022-06-13 DIAGNOSIS — R600 Localized edema: Secondary | ICD-10-CM

## 2022-06-13 DIAGNOSIS — M25552 Pain in left hip: Secondary | ICD-10-CM

## 2022-06-13 DIAGNOSIS — R293 Abnormal posture: Secondary | ICD-10-CM | POA: Diagnosis not present

## 2022-06-13 DIAGNOSIS — R262 Difficulty in walking, not elsewhere classified: Secondary | ICD-10-CM | POA: Diagnosis not present

## 2022-06-13 DIAGNOSIS — M25562 Pain in left knee: Secondary | ICD-10-CM

## 2022-06-20 ENCOUNTER — Encounter: Payer: Self-pay | Admitting: *Deleted

## 2022-06-21 ENCOUNTER — Ambulatory Visit (INDEPENDENT_AMBULATORY_CARE_PROVIDER_SITE_OTHER): Payer: PRIVATE HEALTH INSURANCE | Admitting: Rehabilitative and Restorative Service Providers"

## 2022-06-21 ENCOUNTER — Encounter: Payer: Self-pay | Admitting: Rehabilitative and Restorative Service Providers"

## 2022-06-21 DIAGNOSIS — M25552 Pain in left hip: Secondary | ICD-10-CM

## 2022-06-21 DIAGNOSIS — M25562 Pain in left knee: Secondary | ICD-10-CM

## 2022-06-21 DIAGNOSIS — M5459 Other low back pain: Secondary | ICD-10-CM

## 2022-06-21 DIAGNOSIS — R262 Difficulty in walking, not elsewhere classified: Secondary | ICD-10-CM | POA: Diagnosis not present

## 2022-06-21 DIAGNOSIS — R293 Abnormal posture: Secondary | ICD-10-CM | POA: Diagnosis not present

## 2022-06-21 DIAGNOSIS — R600 Localized edema: Secondary | ICD-10-CM
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
## 2022-06-25 ENCOUNTER — Telehealth: Payer: Self-pay | Admitting: Radiology

## 2022-06-25 ENCOUNTER — Ambulatory Visit: Payer: PRIVATE HEALTH INSURANCE | Admitting: Orthopaedic Surgery

## 2022-06-25 NOTE — Telephone Encounter (Signed)
Patient came in to office with STD forms to be completed by [employer]. His appointment to review MRI with Dr. Ophelia Charter is now scheduled on 07/16/2022. We have been waiting for insurance approval and scheduling. Per Dr. Ophelia Charter, ok to do work note, waiting on his benefit coverage to approve and schedule MRI. Patient out of work until MRI review in office. Note entered.

## 2022-06-27 ENCOUNTER — Encounter: Payer: Self-pay | Admitting: Orthopaedic Surgery

## 2022-07-04 ENCOUNTER — Ambulatory Visit (INDEPENDENT_AMBULATORY_CARE_PROVIDER_SITE_OTHER): Payer: PRIVATE HEALTH INSURANCE | Admitting: Rehabilitative and Restorative Service Providers"

## 2022-07-04 ENCOUNTER — Encounter: Payer: Self-pay | Admitting: Rehabilitative and Restorative Service Providers"

## 2022-07-04 DIAGNOSIS — R262 Difficulty in walking, not elsewhere classified: Secondary | ICD-10-CM

## 2022-07-04 DIAGNOSIS — M5459 Other low back pain: Secondary | ICD-10-CM

## 2022-07-04 DIAGNOSIS — R600 Localized edema: Secondary | ICD-10-CM

## 2022-07-04 DIAGNOSIS — R293 Abnormal posture: Secondary | ICD-10-CM | POA: Diagnosis not present

## 2022-07-04 DIAGNOSIS — M25562 Pain in left knee: Secondary | ICD-10-CM

## 2022-07-04 DIAGNOSIS — M25552 Pain in left hip: Secondary | ICD-10-CM
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
## 2022-07-05 ENCOUNTER — Encounter: Payer: Self-pay | Admitting: Physical Therapy

## 2022-07-05 ENCOUNTER — Ambulatory Visit (INDEPENDENT_AMBULATORY_CARE_PROVIDER_SITE_OTHER): Payer: PRIVATE HEALTH INSURANCE | Admitting: Physical Therapy

## 2022-07-05 DIAGNOSIS — M5459 Other low back pain: Secondary | ICD-10-CM

## 2022-07-05 DIAGNOSIS — R262 Difficulty in walking, not elsewhere classified: Secondary | ICD-10-CM | POA: Diagnosis not present

## 2022-07-05 DIAGNOSIS — M25552 Pain in left hip: Secondary | ICD-10-CM | POA: Diagnosis not present

## 2022-07-05 DIAGNOSIS — R600 Localized edema: Secondary | ICD-10-CM

## 2022-07-05 DIAGNOSIS — R293 Abnormal posture: Secondary | ICD-10-CM | POA: Diagnosis not present

## 2022-07-05 DIAGNOSIS — M25562 Pain in left knee: Secondary | ICD-10-CM
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
## 2022-07-08 ENCOUNTER — Ambulatory Visit (INDEPENDENT_AMBULATORY_CARE_PROVIDER_SITE_OTHER): Payer: PRIVATE HEALTH INSURANCE | Admitting: Physical Therapy

## 2022-07-08 ENCOUNTER — Encounter: Payer: Self-pay | Admitting: Physical Therapy

## 2022-07-08 DIAGNOSIS — R293 Abnormal posture: Secondary | ICD-10-CM | POA: Diagnosis not present

## 2022-07-08 DIAGNOSIS — M25552 Pain in left hip: Secondary | ICD-10-CM | POA: Diagnosis not present

## 2022-07-08 DIAGNOSIS — M25562 Pain in left knee: Secondary | ICD-10-CM

## 2022-07-08 DIAGNOSIS — M5459 Other low back pain: Secondary | ICD-10-CM | POA: Diagnosis not present

## 2022-07-08 DIAGNOSIS — R600 Localized edema: Secondary | ICD-10-CM

## 2022-07-08 DIAGNOSIS — R262 Difficulty in walking, not elsewhere classified: Secondary | ICD-10-CM | POA: Diagnosis not present
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
## 2022-07-11 ENCOUNTER — Ambulatory Visit (INDEPENDENT_AMBULATORY_CARE_PROVIDER_SITE_OTHER): Payer: PRIVATE HEALTH INSURANCE | Admitting: Physical Therapy

## 2022-07-11 DIAGNOSIS — R293 Abnormal posture: Secondary | ICD-10-CM | POA: Diagnosis not present

## 2022-07-11 DIAGNOSIS — M25552 Pain in left hip: Secondary | ICD-10-CM

## 2022-07-11 DIAGNOSIS — M5459 Other low back pain: Secondary | ICD-10-CM

## 2022-07-11 DIAGNOSIS — R262 Difficulty in walking, not elsewhere classified: Secondary | ICD-10-CM

## 2022-07-11 DIAGNOSIS — M25562 Pain in left knee: Secondary | ICD-10-CM

## 2022-07-11 DIAGNOSIS — R600 Localized edema: Secondary | ICD-10-CM

## 2022-07-16 ENCOUNTER — Ambulatory Visit (INDEPENDENT_AMBULATORY_CARE_PROVIDER_SITE_OTHER): Payer: PRIVATE HEALTH INSURANCE | Admitting: Orthopaedic Surgery

## 2022-07-16 DIAGNOSIS — S300XXA Contusion of lower back and pelvis, initial encounter: Secondary | ICD-10-CM | POA: Insufficient documentation

## 2022-07-16 DIAGNOSIS — S300XXS Contusion of lower back and pelvis, sequela: Secondary | ICD-10-CM
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
## 2022-07-19 ENCOUNTER — Encounter: Payer: PRIVATE HEALTH INSURANCE | Admitting: Rehabilitative and Restorative Service Providers"

## 2022-07-22 ENCOUNTER — Other Ambulatory Visit: Payer: Self-pay | Admitting: Family

## 2022-07-26 ENCOUNTER — Other Ambulatory Visit: Payer: Self-pay | Admitting: Family

## 2022-07-26 ENCOUNTER — Encounter: Payer: Self-pay | Admitting: Family

## 2022-07-26 MED ORDER — MOUNJARO 7.5 MG/0.5ML ~~LOC~~ SOAJ
SUBCUTANEOUS | 0 refills | Status: DC
Start: 2022-07-26 — End: 2022-08-12

## 2022-07-26 NOTE — Telephone Encounter (Signed)
Error

## 2022-07-29 NOTE — Telephone Encounter (Signed)
ERROR

## 2022-08-01 NOTE — Telephone Encounter (Signed)
Error

## 2022-08-07 ENCOUNTER — Encounter: Payer: Self-pay | Admitting: Family

## 2022-08-09 ENCOUNTER — Other Ambulatory Visit: Payer: Self-pay | Admitting: Family

## 2022-08-12 ENCOUNTER — Other Ambulatory Visit: Payer: Self-pay | Admitting: Family

## 2022-08-12 MED ORDER — MOUNJARO 7.5 MG/0.5ML ~~LOC~~ SOAJ: SUBCUTANEOUS | 0 refills | Status: DC

## 2022-10-27 ENCOUNTER — Encounter: Payer: Self-pay | Admitting: Family

## 2022-11-04 ENCOUNTER — Other Ambulatory Visit: Payer: Self-pay | Admitting: Family

## 2022-11-04 NOTE — Telephone Encounter (Signed)
Okay to increase to [facility] 10mg ?

## 2022-11-20 ENCOUNTER — Encounter: Payer: Self-pay | Admitting: Family

## 2022-11-20 MED ORDER — ATORVASTATIN CALCIUM 20 MG PO TABS: 20.0000 mg | ORAL_TABLET | Freq: Every day | ORAL | 1 refills | Status: DC

## 2022-11-20 MED ORDER — AMLODIPINE BESYLATE 10 MG PO TABS: 10.0000 mg | ORAL_TABLET | Freq: Every day | ORAL | 1 refills | Status: DC

## 2022-11-20 MED ORDER — VALSARTAN 160 MG PO TABS: 160.0000 mg | ORAL_TABLET | Freq: Every day | ORAL | 1 refills | Status: DC

## 2022-11-29 ENCOUNTER — Other Ambulatory Visit: Payer: Self-pay | Admitting: Family

## 2022-12-30 ENCOUNTER — Other Ambulatory Visit: Payer: Self-pay | Admitting: Family

## 2023-01-02 ENCOUNTER — Telehealth: Payer: Self-pay | Admitting: Family

## 2023-01-02 NOTE — Telephone Encounter (Signed)
Pt said the pharmacy does not have mounjaro in stock and he wanted to know if there is something else he can take that would be similar. He said he will be out of his medication tomorrow. Pt uses [facility] on [address]. Please call pt to advise.

## 2023-01-03 ENCOUNTER — Other Ambulatory Visit: Payer: Self-pay | Admitting: Family

## 2023-01-03 MED ORDER — TIRZEPATIDE 5 MG/0.5ML ~~LOC~~ SOAJ: 5.0000 mg | SUBCUTANEOUS | 0 refills | Status: DC

## 2023-01-03 NOTE — Telephone Encounter (Signed)
Spoke with pt, pt states he would like for a Rx for Monjaro 5mg  to be sent to pharmacy. Pt stated if the 5mg  is not in stock he will reach back out to [place] on 01/06/2023.

## 2023-02-06 ENCOUNTER — Telehealth: Payer: Self-pay | Admitting: Family

## 2023-02-06 ENCOUNTER — Other Ambulatory Visit: Payer: Self-pay | Admitting: Family

## 2023-02-06 MED ORDER — MOUNJARO 7.5 MG/0.5ML ~~LOC~~ SOAJ: SUBCUTANEOUS | 0 refills | Status: DC

## 2023-02-06 NOTE — Telephone Encounter (Signed)
Spoke with pt, pt states he was taking  monjuaro previously. Pt was informed 7.5mg  has been sent into pharmacy pt would like to know if the dose could be upped again. Informed pt he is over due for a follow up appt pt stated he is at work and will call back at a later time to schedule appt.

## 2023-02-06 NOTE — Telephone Encounter (Signed)
Pt has appt scheduled for June would you like him to schedule sooner?

## 2023-02-06 NOTE — Telephone Encounter (Signed)
lw 

## 2023-02-06 NOTE — Telephone Encounter (Signed)
What dose of Greggory Keen is he currently taking? I know he has had problems finding the medication.  He is overdue for OV.

## 2023-02-08 ENCOUNTER — Other Ambulatory Visit: Payer: Self-pay | Admitting: Family

## 2023-02-18 ENCOUNTER — Telehealth: Payer: Self-pay | Admitting: Family

## 2023-02-18 ENCOUNTER — Other Ambulatory Visit: Payer: Self-pay | Admitting: Family

## 2023-02-18 MED ORDER — TIRZEPATIDE 5 MG/0.5ML ~~LOC~~ SOAJ
5.0000 mg | SUBCUTANEOUS | 0 refills | Status: DC
Start: 2023-02-18 — End: 2023-03-25

## 2023-02-18 NOTE — Telephone Encounter (Signed)
Arley Phenix (spouse DPR Ok) called stating pt's pharmaacy is out of the 7.5 Mounjaro and was wondering if she can have the 5 sent into the pharmacy.

## 2023-02-19 NOTE — Telephone Encounter (Signed)
Patient's wife called stating they are now out of the 7.5 and they want to know if they can get the 10 mg since that's the one they have.

## 2023-02-21 ENCOUNTER — Other Ambulatory Visit: Payer: Self-pay | Admitting: Family

## 2023-02-21 MED ORDER — METFORMIN HCL ER 500 MG PO TB24: 500.0000 mg | ORAL_TABLET | Freq: Two times a day (BID) | ORAL | 1 refills | Status: DC

## 2023-02-21 NOTE — Telephone Encounter (Signed)
Spoke with pt wife, she is aware and expressed understanding Rx has been sent in and pt will take one in the morning and one in the evening.

## 2023-02-21 NOTE — Telephone Encounter (Signed)
Spoke with pts wife, she states the pharmacist states they are completely out of stock of Mounjaro 2.7-7.5, she states pts sugar was high last night and was wondering if there is an alternative pt can take until the [facility] is back in stock.

## 2023-03-15 ENCOUNTER — Other Ambulatory Visit: Payer: Self-pay | Admitting: Family

## 2023-03-25 ENCOUNTER — Ambulatory Visit (INDEPENDENT_AMBULATORY_CARE_PROVIDER_SITE_OTHER): Payer: PRIVATE HEALTH INSURANCE | Admitting: Family

## 2023-03-25 ENCOUNTER — Encounter: Payer: Self-pay | Admitting: Family

## 2023-03-25 ENCOUNTER — Other Ambulatory Visit (HOSPITAL_BASED_OUTPATIENT_CLINIC_OR_DEPARTMENT_OTHER): Payer: Self-pay

## 2023-03-25 ENCOUNTER — Other Ambulatory Visit (HOSPITAL_COMMUNITY): Payer: Self-pay

## 2023-03-25 ENCOUNTER — Other Ambulatory Visit: Payer: Self-pay | Admitting: Family

## 2023-03-25 VITALS — BP 130/80 | HR 82 | Ht 73.0 in | Wt 379.8 lb

## 2023-03-25 DIAGNOSIS — R413 Other amnesia: Secondary | ICD-10-CM

## 2023-03-25 DIAGNOSIS — Z125 Encounter for screening for malignant neoplasm of prostate: Secondary | ICD-10-CM | POA: Diagnosis not present

## 2023-03-25 DIAGNOSIS — R7309 Other abnormal glucose: Secondary | ICD-10-CM

## 2023-03-25 DIAGNOSIS — E119 Type 2 diabetes mellitus without complications: Secondary | ICD-10-CM | POA: Diagnosis not present

## 2023-03-25 DIAGNOSIS — Z7985 Long-term (current) use of injectable non-insulin antidiabetic drugs: Secondary | ICD-10-CM

## 2023-03-25 DIAGNOSIS — Z0000 Encounter for general adult medical examination without abnormal findings: Secondary | ICD-10-CM | POA: Diagnosis not present

## 2023-03-25 DIAGNOSIS — Z13220 Encounter for screening for lipoid disorders: Secondary | ICD-10-CM

## 2023-03-25 DIAGNOSIS — R7989 Other specified abnormal findings of blood chemistry: Secondary | ICD-10-CM

## 2023-03-25 LAB — CBC WITH DIFFERENTIAL/PLATELET
Basophils Absolute: 0 10*3/uL (ref 0.0–0.1)
Basophils Relative: 0.7 % (ref 0.0–3.0)
Eosinophils Absolute: 0.1 10*3/uL (ref 0.0–0.7)
Eosinophils Relative: 1.2 % (ref 0.0–5.0)
HCT: 40.1 % (ref 39.0–52.0)
Hemoglobin: 12.7 g/dL — ABNORMAL LOW (ref 13.0–17.0)
Lymphocytes Relative: 31.8 % (ref 12.0–46.0)
Lymphs Abs: 1.5 10*3/uL (ref 0.7–4.0)
MCHC: 31.7 g/dL (ref 30.0–36.0)
MCV: 89.9 fl (ref 78.0–100.0)
Monocytes Absolute: 0.5 10*3/uL (ref 0.1–1.0)
Monocytes Relative: 9.6 % (ref 3.0–12.0)
Neutro Abs: 2.7 10*3/uL (ref 1.4–7.7)
Neutrophils Relative %: 56.7 % (ref 43.0–77.0)
Platelets: 225 10*3/uL (ref 150.0–400.0)
RBC: 4.46 Mil/uL (ref 4.22–5.81)
RDW: 14.1 % (ref 11.5–15.5)
WBC: 4.8 10*3/uL (ref 4.0–10.5)

## 2023-03-25 LAB — COMPREHENSIVE METABOLIC PANEL
ALT: 30 U/L (ref 0–53)
AST: 24 U/L (ref 0–37)
Albumin: 4.5 g/dL (ref 3.5–5.2)
Alkaline Phosphatase: 54 U/L (ref 39–117)
BUN: 10 mg/dL (ref 6–23)
CO2: 34 mEq/L — ABNORMAL HIGH (ref 19–32)
Calcium: 9.4 mg/dL (ref 8.4–10.5)
Chloride: 98 mEq/L (ref 96–112)
Creatinine, Ser: 0.9 mg/dL (ref 0.40–1.50)
GFR: 106.26 mL/min (ref >60.00)
Glucose, Bld: 117 mg/dL — ABNORMAL HIGH (ref 70–99)
Potassium: 4.2 mEq/L (ref 3.5–5.1)
Sodium: 139 mEq/L (ref 135–145)
Total Bilirubin: 0.6 mg/dL (ref 0.2–1.2)
Total Protein: 7.8 g/dL (ref 6.0–8.3)

## 2023-03-25 LAB — TSH: TSH: 1.86 u[IU]/mL (ref 0.35–5.50)

## 2023-03-25 LAB — LIPID PANEL
Cholesterol: 114 mg/dL (ref 0–200)
HDL: 45.1 mg/dL (ref >39.00)
LDL Cholesterol: 51 mg/dL (ref 0–99)
NonHDL: 68.75
Total CHOL/HDL Ratio: 3
Triglycerides: 88 mg/dL (ref 0.0–149.0)
VLDL: 17.6 mg/dL (ref 0.0–40.0)

## 2023-03-25 LAB — MICROALBUMIN / CREATININE URINE RATIO
Creatinine,U: 92.5 mg/dL
Microalb Creat Ratio: 1.1 mg/g (ref 0.0–30.0)
Microalb, Ur: 1 mg/dL (ref 0.0–1.9)

## 2023-03-25 LAB — VITAMIN B12: Vitamin B-12: 298 pg/mL (ref 211–911)

## 2023-03-25 LAB — PSA: PSA: 0.14 ng/mL (ref 0.10–4.00)

## 2023-03-25 LAB — HEMOGLOBIN A1C: Hgb A1c MFr Bld: 5.8 % (ref 4.6–6.5)

## 2023-03-25 MED ORDER — TIRZEPATIDE 2.5 MG/0.5ML ~~LOC~~ SOAJ
2.5000 mg | SUBCUTANEOUS | 0 refills | Status: DC
  Filled 2023-03-25 (×3): qty 2, 28d supply, fill #0

## 2023-03-25 MED ORDER — AMLODIPINE BESYLATE-VALSARTAN 10-160 MG PO TABS: 1.0000 | ORAL_TABLET | Freq: Every day | ORAL | 1 refills | Status: DC

## 2023-03-25 NOTE — Progress Notes (Signed)
Kyle Marsh is a 42 y.o. male with the following history as recorded in EpicCare:  Patient Active Problem List   Diagnosis Date Noted   Lumbar contusion 07/16/2022   Obesity, morbid, BMI 50 or higher (HCC) 03/22/2022   Low testosterone in male 09/25/2018   Sleep apnea 09/25/2018   Benign hypertension 02/05/2017   GERD 11/01/2009   WEIGHT GAIN, ABNORMAL 11/01/2009    Current Outpatient Medications  Medication Sig Dispense Refill   amLODipine-valsartan (EXFORGE) 10-160 MG tablet Take 1 tablet by mouth daily. 90 tablet 1   atorvastatin (LIPITOR) 20 MG tablet Take 1 tablet (20 mg total) by mouth daily. 90 tablet 1   Multiple Vitamin (MULTI VITAMIN MENS PO) Take by mouth.     OneTouch Delica Lancets 33G MISC USE UP TO 4 TIMES A DAY 100 each 1   ONETOUCH ULTRA test strip USE UP TO 4 TIMES A DAY AS DIRECTED 100 strip 1   tirzepatide (MOUNJARO) 2.5 MG/0.5ML Pen Inject 2.5 mg into the skin once a week. 2 mL 0   metFORMIN (GLUCOPHAGE-XR) 500 MG 24 hr tablet TAKE 1 TABLET BY MOUTH TWICE A DAY (Patient not taking: Reported on 03/25/2023) 180 tablet 1   No current facility-administered medications for this visit.    Allergies: Patient has no known allergies.  Past Medical History:  Diagnosis Date   Diabetes mellitus without complication (HCC)    ED (erectile dysfunction)    Hypertension    Morbid obesity (HCC)    OSA on CPAP    Varicose veins of both legs with edema     No past surgical history on file.  Family History  Problem Relation Age of Onset   Arthritis Mother    Diabetes Mother    High blood pressure Mother    Arthritis Father    High Cholesterol Father    High blood pressure Father    Diabetes Maternal Grandmother    Kidney disease Maternal Grandmother    Cancer Paternal Grandmother    Diabetes Paternal Grandmother    High blood pressure Paternal Grandmother    Arthritis Paternal Grandfather    Diabetes Paternal Grandfather     Social History   Tobacco Use    Smoking status: Never   Smokeless tobacco: Never  Substance Use Topics   Alcohol use: No    Subjective:   Presents for yearly CPE; accompanied by his wife; had been using Mounjaro and has lost approximately 20 pounds; had to stop the medication due to supply issues- only taking Metformin at this time; would like to re-start if possible; Denies any chest pain, shortness of breath, blurred vision or headache  Review of Systems  Constitutional:  Positive for weight loss.       Planned weight loss  HENT: Negative.    Eyes: Negative.   Respiratory: Negative.    Cardiovascular: Negative.   Gastrointestinal: Negative.   Genitourinary: Negative.   Musculoskeletal: Negative.   Skin: Negative.   Neurological: Negative.   Endo/Heme/Allergies: Negative.   Psychiatric/Behavioral: Negative.         Objective:  Vitals:   03/25/23 1050 03/25/23 1126  BP: (!) 142/90 130/80  Pulse: 82   SpO2: 98%   Weight: (!) 379 lb 12.8 oz (172.3 kg)   Height: 6\' 1"  (1.854 m)     General: Well developed, well nourished, in no acute distress  Skin : Warm and dry.  Head: Normocephalic and atraumatic  Eyes: Sclera and conjunctiva clear; pupils round and reactive  to light; extraocular movements intact  Ears: External normal; canals clear; tympanic membranes normal  Oropharynx: Pink, supple. No suspicious lesions  Neck: Supple without thyromegaly, adenopathy  Lungs: Respirations unlabored; clear to auscultation bilaterally without wheeze, rales, rhonchi  CVS exam: normal rate and regular rhythm.  Abdomen: Soft; nontender; nondistended; normoactive bowel sounds; no masses or hepatosplenomegaly  Musculoskeletal: No deformities; no active joint inflammation  Extremities: No edema, cyanosis, clubbing  Vessels: Symmetric bilaterally  Neurologic: Alert and oriented; speech intact; face symmetrical; moves all extremities well; CNII-XII intact without focal deficit   Assessment:  1. PE (physical exam), annual    2. Lipid screening   3. Elevated glucose   4. Prostate cancer screening   5. Low vitamin B12 level   6. 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  Orders Placed This Encounter  Procedures   CBC with Differential/Platelet   Comp Met (CMET)   Lipid panel   Hemoglobin A1c   PSA   Urine Microalbumin w/creat. ratio   B12   TSH   Ambulatory referral to Neurology    Referral Priority:   Routine    Referral Type:   Consultation    Referral Reason:   Specialty Services Required    Requested Specialty:   Neurology    Number of Visits Requested:   1    Requested Prescriptions   Signed Prescriptions Disp Refills   tirzepatide (MOUNJARO) 2.5 MG/0.5ML Pen 2 mL 0    Sig: Inject 2.5 mg into the skin once a week.   amLODipine-valsartan (EXFORGE) 10-160 MG tablet 90 tablet 1    Sig: Take 1 tablet by mouth daily.

## 2023-03-31 ENCOUNTER — Ambulatory Visit
Admission: EM | Admit: 2023-03-31 | Discharge: 2023-03-31 | Disposition: A | Discharge status: Home / Self Care | Payer: PRIVATE HEALTH INSURANCE | Attending: Internal Medicine | Admitting: Internal Medicine

## 2023-03-31 DIAGNOSIS — H60391 Other infective otitis externa, right ear: Secondary | ICD-10-CM

## 2023-03-31 MED ORDER — AMOXICILLIN-POT CLAVULANATE 875-125 MG PO TABS: 1.0000 | ORAL_TABLET | Freq: Two times a day (BID) | ORAL | 0 refills | Status: DC

## 2023-03-31 MED ORDER — KETOROLAC TROMETHAMINE 30 MG/ML IJ SOLN
30.0000 mg | Freq: Once | INTRAMUSCULAR | Status: AC
  Administered 2023-03-31: 30 mg via INTRAMUSCULAR

## 2023-03-31 MED ORDER — CIPROFLOXACIN-DEXAMETHASONE 0.3-0.1 % OT SUSP: 4.0000 [drp] | Freq: Two times a day (BID) | OTIC | 0 refills | Status: AC

## 2023-03-31 NOTE — ED Triage Notes (Signed)
Patient presents to [facility] for right ear pain and drainage since 2 days ago. Treating with tylenol.

## 2023-03-31 NOTE — ED Provider Notes (Signed)
[facility]    CSN: [id number] Arrival date & time: 03/31/23  0802      History   Chief Complaint Chief Complaint  Patient presents with   Otalgia    HPI Kyle Marsh is a 42 y.o. male.   Patient presents with right ear pain and drainage that started about 2 days ago.  Has been taking Tylenol for pain.  Denies trauma to the ear.  Denies stuffy nose, runny nose, fever.   Otalgia   Past Medical History:  Diagnosis Date   Diabetes mellitus without complication [facility])    ED (erectile dysfunction)    Hypertension    Morbid obesity (HCC)    OSA on CPAP    Varicose veins of both legs with edema     Patient Active Problem List   Diagnosis Date Noted   Lumbar contusion 07/16/2022   Obesity, morbid, BMI 50 or higher (HCC) 03/22/2022   Low testosterone in male 09/25/2018   Sleep apnea 09/25/2018   Benign hypertension 02/05/2017   GERD 11/01/2009   WEIGHT GAIN, ABNORMAL 11/01/2009    History reviewed. No pertinent surgical history.     Home Medications    Prior to Admission medications   Medication Sig Start Date End Date Taking? Authorizing Provider  amoxicillin-clavulanate (AUGMENTIN) 875-125 MG tablet Take 1 tablet by mouth every 12 (twelve) hours. 03/31/23  Yes Maleigh Bagot, Rolly Salter E, FNP  ciprofloxacin-dexamethasone (CIPRODEX) OTIC suspension Place 4 drops into the right ear 2 (two) times daily for 7 days. 03/31/23 04/07/23 Yes Rashaun Curl, Llewelyn Sheaffer E, FNP  amLODipine-valsartan (EXFORGE) 10-160 MG tablet Take 1 tablet by mouth daily. 03/25/23   Olive Bass, FNP  atorvastatin (LIPITOR) 20 MG tablet Take 1 tablet (20 mg total) by mouth daily. 11/20/22   Olive Bass, FNP  metFORMIN (GLUCOPHAGE-XR) 500 MG 24 hr tablet TAKE 1 TABLET BY MOUTH TWICE A DAY Patient not taking: Reported on 03/25/2023 03/18/23   Olive Bass, FNP  Multiple Vitamin (MULTI VITAMIN MENS PO) Take by mouth.    [provider]  OneTouch Delica Lancets 33G MISC USE UP  TO 4 TIMES A DAY 01/03/21   Olive Bass, FNP  [facility] ULTRA test strip USE UP TO 4 TIMES A DAY AS DIRECTED 02/10/20   Olive Bass, FNP  tirzepatide [facility]) 2.5 MG/0.5ML Pen Inject 2.5 mg into the skin once a week. 03/25/23   Olive Bass, FNP    Family History Family History  Problem Relation Age of Onset   Arthritis Mother    Diabetes Mother    High blood pressure Mother    Arthritis Father    High Cholesterol Father    High blood pressure Father    Diabetes Maternal Grandmother    Kidney disease Maternal Grandmother    Cancer Paternal Grandmother    Diabetes Paternal Grandmother    High blood pressure Paternal Grandmother    Arthritis Paternal Grandfather    Diabetes Paternal Grandfather     Social History Social History   Tobacco Use   Smoking status: Never   Smokeless tobacco: Never  Substance Use Topics   Alcohol use: No   Drug use: No     Allergies   Patient has no known allergies.   Review of Systems Review of Systems Per HPI  Physical Exam Triage Vital Signs ED Triage Vitals [03/31/23 0818]  Enc Vitals Group     BP 123/72     Pulse Rate 87  Resp 16     Temp 97.9 F (36.6 C)     Temp Source Oral     SpO2 97 %     Weight      Height      Head Circumference      Peak Flow      Pain Score 10     Pain Loc      Pain Edu?      Excl. in GC?    No data found.  Updated Vital Signs BP 123/72 (BP Location: Left Arm)   Pulse 87   Temp 97.9 F (36.6 C) (Oral)   Resp 16   SpO2 97%   Visual Acuity Right Eye Distance:   Left Eye Distance:   Bilateral Distance:    Right Eye Near:   Left Eye Near:    Bilateral Near:     Physical Exam Constitutional:      General: He is not in acute distress.    Appearance: Normal appearance. He is not toxic-appearing or diaphoretic.  HENT:     Head: Normocephalic and atraumatic.     Right Ear: External ear normal.     Ears:     Comments: Patient's right external canal is  very swollen.  Unable to completely visualize TM given amount of swelling.  Mild drainage noted that seems purulent as well.  Patient has tenderness to outside of ear directly in front of ear as well.  No obvious lymph node swelling noted. Eyes:     Extraocular Movements: Extraocular movements intact.     Conjunctiva/sclera: Conjunctivae normal.  Pulmonary:     Effort: Pulmonary effort is normal.  Neurological:     General: No focal deficit present.     Mental Status: He is alert and oriented to person, place, and time. Mental status is at baseline.  Psychiatric:        Mood and Affect: Mood normal.        Behavior: Behavior normal.        Thought Content: Thought content normal.        Judgment: Judgment normal.      UC Treatments / Results  Labs (all labs ordered are listed, but only abnormal results are displayed) Labs Reviewed - No data to display  EKG   Radiology No results found.  Procedures Procedures (including critical care time)  Medications Ordered in UC Medications  ketorolac (TORADOL) 30 MG/ML injection 30 mg (30 mg Intramuscular Given 03/31/23 0839)    Initial Impression / Assessment and Plan / UC Course  I have reviewed the triage vital signs and the nursing notes.  Pertinent labs & imaging results that were available during my care of the patient were reviewed by me and considered in my medical decision making (see chart for details).     Patient appears to have significant right otitis externa.  Will treat with oral Augmentin and Ciprodex antibiotic drops.  IM Toradol administered in urgent care and patient advised to not take any additional NSAIDs for least 24 hours following injection.  Advised strict follow-up precautions if any symptoms persist or worsen.  Patient and wife verbalized understanding and were agreeable with plan. Final Clinical Impressions(s) / UC Diagnoses   Final diagnoses:  Infective otitis externa of right ear     Discharge  Instructions      You were given a Toradol shot today in urgent care to help alleviate pain and inflammation.  Do not take any ibuprofen, Advil, Aleve for  at least 24 hours following injection.  I have also prescribed you antibiotic eardrops and an antibiotic that you will take by mouth for right ear infection.  Follow-up if any symptoms persist or worsen.    ED Prescriptions     Medication Sig Dispense Auth. Provider   amoxicillin-clavulanate (AUGMENTIN) 875-125 MG tablet Take 1 tablet by mouth every 12 (twelve) hours. 14 tablet [place], Hollister E, [place]   ciprofloxacin-dexamethasone (CIPRODEX) OTIC suspension Place 4 drops into the right ear 2 (two) times daily for 7 days. 2.8 mL Gustavus Bryant, [place]      PDMP not reviewed this encounter.   Gustavus Bryant, [place] 03/31/23 [phone]

## 2023-03-31 NOTE — Discharge Instructions (Signed)
You were given a Toradol shot today in urgent care to help alleviate pain and inflammation.  Do not take any ibuprofen, Advil, Aleve for at least 24 hours following injection.  I have also prescribed you antibiotic eardrops and an antibiotic that you will take by mouth for right ear infection.  Follow-up if any symptoms persist or worsen.

## 2023-04-02 ENCOUNTER — Ambulatory Visit
Admission: EM | Admit: 2023-04-02 | Discharge: 2023-04-02 | Disposition: A | Discharge status: Home / Self Care | Payer: PRIVATE HEALTH INSURANCE | Attending: Emergency Medicine | Admitting: Emergency Medicine

## 2023-04-02 DIAGNOSIS — H60392 Other infective otitis externa, left ear: Secondary | ICD-10-CM | POA: Diagnosis not present

## 2023-04-02 MED ORDER — PREDNISONE 20 MG PO TABS: 40.0000 mg | ORAL_TABLET | Freq: Every day | ORAL | 0 refills | Status: DC

## 2023-04-02 NOTE — Discharge Instructions (Signed)
Left ear is infected as well  Due to significant swelling on both the ears begin prednisone every morning with food for 5 days to help reduce the swelling and also help to decrease pain  Continue taking both eardrops and oral antibiotics as directed, can take up to 48 to 72 hours for antibiotics to fully get into the system and you to see a difference  You may take Tylenol 500 to 1000 mg every 6 hours in addition to prescribed medications  You may hold warm compresses to the outer ear  Refrain from any ear cleaning till medications are complete and all symptoms have resolved  May follow-up with his urgent care as needed

## 2023-04-02 NOTE — ED Triage Notes (Signed)
Patient here today with c/o left ear pain X 1 day. He is currently taking oral antibiotics for right ear infection for 2 days which seems to be getting better but then the left ear started hurting yesterday.

## 2023-04-02 NOTE — ED Provider Notes (Signed)
[facility]    CSN: [id number] Arrival date & time: 04/02/23  1306      History   Chief Complaint Chief Complaint  Patient presents with   Otalgia    HPI Kyle Marsh is a 42 y.o. male.   Patient presents for evaluation of left-sided ear pain beginning 1 day ago.  Was recently evaluated on 03/31/2023, diagnosed with a outer ear infection to the right side, started oral antibiotics and eardrops, has taken 1 full day of medication.  Endorses the left ear feels very similar.  Denies decreased hearing, pruritus, fever or congestion.    Past Medical History:  Diagnosis Date   Diabetes mellitus without complication [facility])    ED (erectile dysfunction)    Hypertension    Morbid obesity (HCC)    OSA on CPAP    Varicose veins of both legs with edema     Patient Active Problem List   Diagnosis Date Noted   Lumbar contusion 07/16/2022   Obesity, morbid, BMI 50 or higher (HCC) 03/22/2022   Low testosterone in male 09/25/2018   Sleep apnea 09/25/2018   Benign hypertension 02/05/2017   GERD 11/01/2009   WEIGHT GAIN, ABNORMAL 11/01/2009    History reviewed. No pertinent surgical history.     Home Medications    Prior to Admission medications   Medication Sig Start Date End Date Taking? Authorizing Provider  amLODipine-valsartan (EXFORGE) 10-160 MG tablet Take 1 tablet by mouth daily. 03/25/23  Yes Olive Bass, FNP  amoxicillin-clavulanate (AUGMENTIN) 875-125 MG tablet Take 1 tablet by mouth every 12 (twelve) hours. 03/31/23  Yes [place], Rolly Salter E, FNP  atorvastatin (LIPITOR) 20 MG tablet Take 1 tablet (20 mg total) by mouth daily. 11/20/22  Yes Olive Bass, FNP  ciprofloxacin-dexamethasone [facility]) OTIC suspension Place 4 drops into the right ear 2 (two) times daily for 7 days. 03/31/23 04/07/23 Yes [place], Acie Fredrickson, FNP  Multiple Vitamin (MULTI VITAMIN MENS PO) Take by mouth.   Yes [provider]  tirzepatide [facility]gg[facility] Keen) 2.5 MG/0.5ML Pen  Inject 2.5 mg into the skin once a week. 03/25/23  Yes Olive Bass, FNP  metFORMIN (GLUCOPHAGE-XR) 500 MG 24 hr tablet TAKE 1 TABLET BY MOUTH TWICE A DAY Patient not taking: Reported on 03/25/2023 03/18/23   Olive Bass, FNP  OneTouch Delica Lancets 33G MISC USE UP TO 4 TIMES A DAY 01/03/21   Olive Bass, FNP  [facility] ULTRA test strip USE UP TO 4 TIMES A DAY AS DIRECTED 02/10/20   Olive Bass, FNP    Family History Family History  Problem Relation Age of Onset   Arthritis Mother    Diabetes Mother    High blood pressure Mother    Arthritis Father    High Cholesterol Father    High blood pressure Father    Diabetes Maternal Grandmother    Kidney disease Maternal Grandmother    Cancer Paternal Grandmother    Diabetes Paternal Grandmother    High blood pressure Paternal Grandmother    Arthritis Paternal Grandfather    Diabetes Paternal Grandfather     Social History Social History   Tobacco Use   Smoking status: Never   Smokeless tobacco: Never  Substance Use Topics   Alcohol use: No   Drug use: No     Allergies   Patient has no known allergies.   Review of Systems Review of Systems  HENT:  Positive for ear pain.      Physical Exam  Triage Vital Signs ED Triage Vitals  Enc Vitals Group     BP 04/02/23 1327 116/75     Pulse Rate 04/02/23 1327 86     Resp 04/02/23 1327 16     Temp 04/02/23 1327 98.7 F (37.1 C)     Temp Source 04/02/23 1327 Oral     SpO2 04/02/23 1327 95 %     Weight 04/02/23 1325 (!) 379 lb (171.9 kg)     Height 04/02/23 1325 6\' 1"  (1.854 m)     Head Circumference --      Peak Flow --      Pain Score 04/02/23 1325 9     Pain Loc --      Pain Edu? --      Excl. in GC? --    No data found.  Updated Vital Signs BP 116/75 (BP Location: Right Arm)   Pulse 86   Temp 98.7 F (37.1 C) (Oral)   Resp 16   Ht 6\' 1"  (1.854 m)   Wt (!) 379 lb (171.9 kg)   SpO2 95%   BMI 50.00 kg/m   Visual  Acuity Right Eye Distance:   Left Eye Distance:   Bilateral Distance:    Right Eye Near:   Left Eye Near:    Bilateral Near:     Physical Exam Constitutional:      Appearance: Normal appearance.  HENT:     Ears:     Comments: Moderate swelling and Zoe Goonan puslike drainage present to the left ear canal, unable to visualize the tympanic membrane, no external abnormalities  Severe swelling and severe puslike drainage present to the right ear canal, unable to visualize the tympanic membrane, no external abnormalities Eyes:     Extraocular Movements: Extraocular movements intact.  Pulmonary:     Effort: Pulmonary effort is normal.  Neurological:     Mental Status: He is alert and oriented to person, place, and time.      UC Treatments / Results  Labs (all labs ordered are listed, but only abnormal results are displayed) Labs Reviewed - No data to display  EKG   Radiology No results found.  Procedures Procedures (including critical care time)  Medications Ordered in UC Medications - No data to display  Initial Impression / Assessment and Plan / UC Course  I have reviewed the triage vital signs and the nursing notes.  Pertinent labs & imaging results that were available during my care of the patient were reviewed by me and considered in my medical decision making (see chart for details).  Infective otitis externa of left ear  Presentation is consistent with a outer ear infection, discussed this with patient, due to significant swelling and persisting pain prescribed prednisone additionally, advised continued use of Augmentin and Ciprodex as directed, may use over-the-counter analgesics and warm compresses to the external ear for additional comfort, advised against ear cleaning until symptoms have resolved medicine complete, may follow-up with urgent care as needed Final Clinical Impressions(s) / UC Diagnoses   Final diagnoses:  None   Discharge Instructions   None     ED Prescriptions   None    PDMP not reviewed this encounter.   Valinda Hoar, NP 04/02/23 1348

## 2023-04-11 ENCOUNTER — Encounter: Payer: Self-pay | Admitting: Family

## 2023-04-14 ENCOUNTER — Other Ambulatory Visit (HOSPITAL_COMMUNITY): Payer: Self-pay

## 2023-04-14 ENCOUNTER — Other Ambulatory Visit: Payer: Self-pay | Admitting: Family

## 2023-04-14 MED ORDER — TIRZEPATIDE 5 MG/0.5ML ~~LOC~~ SOAJ
5.0000 mg | SUBCUTANEOUS | 1 refills | Status: DC
  Filled 2023-04-14: qty 2, 28d supply, fill #0

## 2023-04-18 ENCOUNTER — Other Ambulatory Visit (HOSPITAL_COMMUNITY): Payer: Self-pay

## 2023-04-28 ENCOUNTER — Encounter: Payer: Self-pay | Admitting: Family

## 2023-04-28 ENCOUNTER — Other Ambulatory Visit (HOSPITAL_COMMUNITY): Payer: Self-pay

## 2023-04-29 ENCOUNTER — Other Ambulatory Visit: Payer: Self-pay | Admitting: Family

## 2023-04-29 DIAGNOSIS — Z77098 Contact with and (suspected) exposure to other hazardous, chiefly nonmedicinal, chemicals: Secondary | ICD-10-CM

## 2023-05-09 ENCOUNTER — Other Ambulatory Visit: Payer: Self-pay | Admitting: Family

## 2023-05-09 ENCOUNTER — Encounter: Payer: Self-pay | Admitting: Family

## 2023-05-09 ENCOUNTER — Other Ambulatory Visit (HOSPITAL_COMMUNITY): Payer: Self-pay

## 2023-05-09 MED ORDER — TIRZEPATIDE 7.5 MG/0.5ML ~~LOC~~ SOAJ
7.5000 mg | SUBCUTANEOUS | 0 refills | Status: DC
  Filled 2023-05-09: qty 2, 28d supply, fill #0

## 2023-05-24 ENCOUNTER — Other Ambulatory Visit: Payer: Self-pay | Admitting: Family

## 2023-06-06 ENCOUNTER — Other Ambulatory Visit (HOSPITAL_COMMUNITY): Payer: Self-pay

## 2023-06-06 ENCOUNTER — Other Ambulatory Visit: Payer: Self-pay | Admitting: Family

## 2023-06-06 MED ORDER — TIRZEPATIDE 7.5 MG/0.5ML ~~LOC~~ SOAJ: 7.5000 mg | SUBCUTANEOUS | 5 refills | Status: DC

## 2023-06-06 MED ORDER — TIRZEPATIDE 7.5 MG/0.5ML ~~LOC~~ SOAJ
7.5000 mg | SUBCUTANEOUS | 5 refills | Status: DC
  Filled 2023-06-06: qty 2, 28d supply, fill #0
  Filled 2023-07-07 – 2023-07-14 (×2): qty 2, 28d supply, fill #1
  Filled 2023-08-08: qty 2, 28d supply, fill #2
  Filled 2023-09-06: qty 2, 28d supply, fill #3

## 2023-06-09 ENCOUNTER — Other Ambulatory Visit (HOSPITAL_COMMUNITY): Payer: Self-pay

## 2023-06-16 ENCOUNTER — Other Ambulatory Visit (HOSPITAL_COMMUNITY): Payer: Self-pay

## 2023-07-08 ENCOUNTER — Other Ambulatory Visit (HOSPITAL_COMMUNITY): Payer: Self-pay

## 2023-07-14 ENCOUNTER — Other Ambulatory Visit (HOSPITAL_COMMUNITY): Payer: Self-pay

## 2023-07-17 ENCOUNTER — Other Ambulatory Visit (HOSPITAL_COMMUNITY): Payer: Self-pay

## 2023-08-13 ENCOUNTER — Other Ambulatory Visit (HOSPITAL_COMMUNITY): Payer: Self-pay

## 2023-09-08 ENCOUNTER — Other Ambulatory Visit: Payer: Self-pay | Admitting: Family

## 2023-09-08 ENCOUNTER — Other Ambulatory Visit: Payer: Self-pay

## 2023-09-08 ENCOUNTER — Encounter: Payer: Self-pay | Admitting: Family

## 2023-09-09 ENCOUNTER — Encounter: Payer: Self-pay | Admitting: Family

## 2023-09-09 ENCOUNTER — Other Ambulatory Visit (HOSPITAL_COMMUNITY): Payer: Self-pay

## 2023-09-09 ENCOUNTER — Other Ambulatory Visit: Payer: Self-pay

## 2023-09-09 ENCOUNTER — Ambulatory Visit: Payer: 59 | Admitting: Family

## 2023-09-09 VITALS — BP 126/78 | HR 69 | Resp 18 | Ht 73.0 in | Wt 362.0 lb

## 2023-09-09 DIAGNOSIS — E119 Type 2 diabetes mellitus without complications: Secondary | ICD-10-CM

## 2023-09-09 DIAGNOSIS — R7989 Other specified abnormal findings of blood chemistry: Secondary | ICD-10-CM

## 2023-09-09 DIAGNOSIS — Z23 Encounter for immunization: Secondary | ICD-10-CM | POA: Diagnosis not present

## 2023-09-09 DIAGNOSIS — I10 Essential (primary) hypertension: Secondary | ICD-10-CM

## 2023-09-09 DIAGNOSIS — R7309 Other abnormal glucose: Secondary | ICD-10-CM

## 2023-09-09 DIAGNOSIS — Z7985 Long-term (current) use of injectable non-insulin antidiabetic drugs: Secondary | ICD-10-CM | POA: Diagnosis not present

## 2023-09-09 MED ORDER — TIRZEPATIDE 10 MG/0.5ML ~~LOC~~ SOAJ
10.0000 mg | SUBCUTANEOUS | 1 refills | Status: DC
  Filled 2023-09-09: qty 2, 28d supply, fill #0
  Filled 2023-10-02: qty 2, 28d supply, fill #1

## 2023-09-09 NOTE — Progress Notes (Signed)
Kyle Marsh is a 42 y.o. male with the following history as recorded in EpicCare:  Patient Active Problem List   Diagnosis Date Noted   Lumbar contusion 07/16/2022   Obesity, morbid, BMI 50 or higher (HCC) 03/22/2022   Low testosterone in male 09/25/2018   Sleep apnea 09/25/2018   Benign hypertension 02/05/2017   GERD 11/01/2009   WEIGHT GAIN, ABNORMAL 11/01/2009    Current Outpatient Medications  Medication Sig Dispense Refill   amLODipine-valsartan (EXFORGE) 10-160 MG tablet TAKE 1 TABLET BY MOUTH EVERY DAY 90 tablet 1   atorvastatin (LIPITOR) 20 MG tablet TAKE 1 TABLET BY MOUTH EVERY DAY 30 tablet 5   cyanocobalamin (VITAMIN B12) 1000 MCG tablet Take 1,000 mcg by mouth daily.     OneTouch Delica Lancets 33G MISC USE UP TO 4 TIMES A DAY 100 each 1   ONETOUCH ULTRA test strip USE UP TO 4 TIMES A DAY AS DIRECTED 100 strip 1   tirzepatide (MOUNJARO) 10 MG/0.5ML Pen Inject 10 mg into the skin once a week. 2 mL 1   No current facility-administered medications for this visit.    Allergies: Patient has no known allergies.  Past Medical History:  Diagnosis Date   Diabetes mellitus without complication (HCC)    ED (erectile dysfunction)    Hypertension    Morbid obesity (HCC)    OSA on CPAP    Varicose veins of both legs with edema     No past surgical history on file.  Family History  Problem Relation Age of Onset   Arthritis Mother    Diabetes Mother    High blood pressure Mother    Arthritis Father    High Cholesterol Father    High blood pressure Father    Diabetes Maternal Grandmother    Kidney disease Maternal Grandmother    Cancer Paternal Grandmother    Diabetes Paternal Grandmother    High blood pressure Paternal Grandmother    Arthritis Paternal Grandfather    Diabetes Paternal Grandfather     Social History   Tobacco Use   Smoking status: Never   Smokeless tobacco: Never  Substance Use Topics   Alcohol use: No    Subjective:   5 month follow up on  chronic care needs; needs updated Hgba1c for DOT CPE; doing very well on Mounjaro- has lost another 17 pounds since June; no acute concerns today; wonders if dosage of Mounjaro can be increased;   Objective:  Vitals:   09/09/23 1327  BP: 126/78  Pulse: 69  Resp: 18  SpO2: 96%  Weight: (!) 362 lb (164.2 kg)  Height: 6\' 1"  (1.854 m)    General: Well developed, well nourished, in no acute distress  Skin : Warm and dry.  Head: Normocephalic and atraumatic  Eyes: Sclera and conjunctiva clear; pupils round and reactive to light; extraocular movements intact  Ears: External normal; canals clear; tympanic membranes normal  Oropharynx: Pink, supple. No suspicious lesions  Neck: Supple without thyromegaly, adenopathy  Lungs: Respirations unlabored; clear to auscultation bilaterally without wheeze, rales, rhonchi  CVS exam: normal rate and regular rhythm.  Neurologic: Alert and oriented; speech intact; face symmetrical; moves all extremities well; CNII-XII intact without focal deficit   Assessment:  1. Type 2 diabetes mellitus without complication, without long-term current use of insulin (HCC)   2. Elevated glucose   3. Low vitamin B12 level   4. Essential hypertension     Plan:  & 2. Patient is doing very well on  Mounjaro- can try increasing to 10 mg to help with weight loss goals; check CBC, CMP, hgba1c today;  3.   Re-check B12 level today;  4.   Stable; continue same medication.   Return in about 4 months (around 01/07/2024) for follow up.  Orders Placed This Encounter  Procedures   CBC with Differential/Platelet   Comp Met (CMET)   Hemoglobin A1c   B12    Requested Prescriptions   Signed Prescriptions Disp Refills   tirzepatide (MOUNJARO) 10 MG/0.5ML Pen 2 mL 1    Sig: Inject 10 mg into the skin once a week.

## 2023-09-09 NOTE — Addendum Note (Signed)
Addended by: Judieth Keens on: 09/09/2023 02:28 PM   Modules accepted: Orders

## 2023-09-10 ENCOUNTER — Other Ambulatory Visit (HOSPITAL_COMMUNITY): Payer: Self-pay

## 2023-09-10 LAB — HEMOGLOBIN A1C: Hgb A1c MFr Bld: 5.2 % (ref 4.6–6.5)

## 2023-09-10 LAB — CBC WITH DIFFERENTIAL/PLATELET
Basophils Absolute: 0 10*3/uL (ref 0.0–0.1)
Basophils Relative: 1 % (ref 0.0–3.0)
Eosinophils Absolute: 0 10*3/uL (ref 0.0–0.7)
Eosinophils Relative: 0.9 % (ref 0.0–5.0)
HCT: 41.9 % (ref 39.0–52.0)
Hemoglobin: 13.4 g/dL (ref 13.0–17.0)
Lymphocytes Relative: 37.6 % (ref 12.0–46.0)
Lymphs Abs: 1.7 10*3/uL (ref 0.7–4.0)
MCHC: 32 g/dL (ref 30.0–36.0)
MCV: 90.4 fL (ref 78.0–100.0)
Monocytes Absolute: 0.5 10*3/uL (ref 0.1–1.0)
Monocytes Relative: 10.4 % (ref 3.0–12.0)
Neutro Abs: 2.3 10*3/uL (ref 1.4–7.7)
Neutrophils Relative %: 50.1 % (ref 43.0–77.0)
Platelets: 226 10*3/uL (ref 150.0–400.0)
RBC: 4.63 Mil/uL (ref 4.22–5.81)
RDW: 13.8 % (ref 11.5–15.5)
WBC: 4.6 10*3/uL (ref 4.0–10.5)

## 2023-09-10 LAB — COMPREHENSIVE METABOLIC PANEL
ALT: 22 U/L (ref 0–53)
AST: 21 U/L (ref 0–37)
Albumin: 4.5 g/dL (ref 3.5–5.2)
Alkaline Phosphatase: 64 U/L (ref 39–117)
BUN: 11 mg/dL (ref 6–23)
CO2: 31 meq/L (ref 19–32)
Calcium: 9.5 mg/dL (ref 8.4–10.5)
Chloride: 100 meq/L (ref 96–112)
Creatinine, Ser: 1.07 mg/dL (ref 0.40–1.50)
GFR: 86.06 mL/min (ref >60.00)
Glucose, Bld: 78 mg/dL (ref 70–99)
Potassium: 4.2 meq/L (ref 3.5–5.1)
Sodium: 139 meq/L (ref 135–145)
Total Bilirubin: 0.5 mg/dL (ref 0.2–1.2)
Total Protein: 7.6 g/dL (ref 6.0–8.3)

## 2023-09-10 LAB — VITAMIN B12: Vitamin B-12: 576 pg/mL (ref 211–911)

## 2023-09-19 ENCOUNTER — Other Ambulatory Visit: Payer: Self-pay | Admitting: Family

## 2023-09-26 ENCOUNTER — Ambulatory Visit: Payer: 59 | Admitting: Family

## 2023-10-09 ENCOUNTER — Other Ambulatory Visit (HOSPITAL_COMMUNITY): Payer: Self-pay

## 2023-10-10 ENCOUNTER — Other Ambulatory Visit: Payer: Self-pay | Admitting: Family

## 2023-10-10 ENCOUNTER — Encounter: Payer: Self-pay | Admitting: Family

## 2023-10-10 MED ORDER — ATORVASTATIN CALCIUM 20 MG PO TABS: 20.0000 mg | ORAL_TABLET | Freq: Every day | ORAL | 3 refills | Status: DC

## 2023-10-10 MED ORDER — AMLODIPINE BESYLATE-VALSARTAN 10-160 MG PO TABS: 1.0000 | ORAL_TABLET | Freq: Every day | ORAL | 3 refills | Status: DC

## 2023-10-22 ENCOUNTER — Encounter: Payer: Self-pay | Admitting: Family

## 2023-11-01 ENCOUNTER — Other Ambulatory Visit: Payer: Self-pay | Admitting: Family

## 2023-11-03 ENCOUNTER — Other Ambulatory Visit (HOSPITAL_COMMUNITY): Payer: Self-pay

## 2023-11-03 MED ORDER — MOUNJARO 10 MG/0.5ML ~~LOC~~ SOAJ
10.0000 mg | SUBCUTANEOUS | 1 refills | Status: DC
  Filled 2023-11-03: qty 2, 28d supply, fill #0
  Filled 2023-12-01: qty 2, 28d supply, fill #1

## 2023-12-27 ENCOUNTER — Other Ambulatory Visit: Payer: Self-pay | Admitting: Family

## 2023-12-29 ENCOUNTER — Other Ambulatory Visit (HOSPITAL_COMMUNITY): Payer: Self-pay

## 2023-12-29 MED ORDER — MOUNJARO 10 MG/0.5ML ~~LOC~~ SOAJ
10.0000 mg | SUBCUTANEOUS | 1 refills | Status: DC
  Filled 2023-12-29: qty 2, 28d supply, fill #0
  Filled 2024-01-23: qty 2, 28d supply, fill #1

## 2024-02-20 ENCOUNTER — Other Ambulatory Visit (HOSPITAL_COMMUNITY): Payer: Self-pay

## 2024-02-20 ENCOUNTER — Other Ambulatory Visit: Payer: Self-pay | Admitting: Family

## 2024-02-20 MED ORDER — MOUNJARO 10 MG/0.5ML ~~LOC~~ SOAJ
10.0000 mg | SUBCUTANEOUS | 1 refills | Status: DC
  Filled 2024-02-20: qty 2, 28d supply, fill #0
  Filled 2024-03-21: qty 2, 28d supply, fill #1

## 2024-02-26 ENCOUNTER — Other Ambulatory Visit (HOSPITAL_COMMUNITY): Payer: Self-pay

## 2024-04-12 ENCOUNTER — Other Ambulatory Visit (HOSPITAL_BASED_OUTPATIENT_CLINIC_OR_DEPARTMENT_OTHER): Payer: Self-pay

## 2024-04-16 ENCOUNTER — Encounter: Payer: Self-pay | Admitting: Family

## 2024-04-16 ENCOUNTER — Other Ambulatory Visit (HOSPITAL_COMMUNITY): Payer: Self-pay

## 2024-04-16 ENCOUNTER — Other Ambulatory Visit: Payer: Self-pay | Admitting: Family

## 2024-04-16 MED ORDER — AMLODIPINE BESYLATE-VALSARTAN 10-160 MG PO TABS: 1.0000 | ORAL_TABLET | Freq: Every day | ORAL | 3 refills | Status: DC

## 2024-04-16 MED ORDER — MOUNJARO 10 MG/0.5ML ~~LOC~~ SOAJ: 10.0000 mg | SUBCUTANEOUS | 0 refills | Status: DC

## 2024-04-16 MED ORDER — MOUNJARO 10 MG/0.5ML ~~LOC~~ SOAJ
10.0000 mg | SUBCUTANEOUS | 0 refills | Status: DC
  Filled 2024-04-16 – 2024-04-24 (×2): qty 2, 28d supply, fill #0

## 2024-04-16 MED ORDER — ATORVASTATIN CALCIUM 20 MG PO TABS: 20.0000 mg | ORAL_TABLET | Freq: Every day | ORAL | 3 refills | Status: DC

## 2024-04-24 ENCOUNTER — Other Ambulatory Visit (HOSPITAL_COMMUNITY): Payer: Self-pay

## 2024-04-26 ENCOUNTER — Other Ambulatory Visit (HOSPITAL_COMMUNITY): Payer: Self-pay

## 2024-04-26 MED ORDER — AMLODIPINE BESYLATE-VALSARTAN 10-160 MG PO TABS
1.0000 | ORAL_TABLET | Freq: Every day | ORAL | 3 refills | Status: AC
  Filled 2024-04-26 – 2024-11-03 (×2): qty 30, 30d supply, fill #0

## 2024-04-26 MED ORDER — ATORVASTATIN CALCIUM 20 MG PO TABS
20.0000 mg | ORAL_TABLET | Freq: Every day | ORAL | 3 refills | Status: AC
  Filled 2024-04-26 – 2024-11-03 (×2): qty 30, 30d supply, fill #0

## 2024-04-26 MED ORDER — MOUNJARO 10 MG/0.5ML ~~LOC~~ SOAJ
10.0000 mg | SUBCUTANEOUS | 0 refills | Status: DC
Start: 2024-04-26 — End: 2024-04-30
  Filled 2024-04-26: qty 2, 28d supply, fill #0

## 2024-04-30 ENCOUNTER — Ambulatory Visit (INDEPENDENT_AMBULATORY_CARE_PROVIDER_SITE_OTHER): Admitting: Family

## 2024-04-30 ENCOUNTER — Other Ambulatory Visit (HOSPITAL_COMMUNITY): Payer: Self-pay

## 2024-04-30 ENCOUNTER — Encounter: Payer: Self-pay | Admitting: Family

## 2024-04-30 VITALS — BP 130/78 | HR 94 | Ht 73.0 in | Wt 336.0 lb

## 2024-04-30 DIAGNOSIS — G4730 Sleep apnea, unspecified: Secondary | ICD-10-CM | POA: Diagnosis not present

## 2024-04-30 DIAGNOSIS — Z0000 Encounter for general adult medical examination without abnormal findings: Secondary | ICD-10-CM | POA: Diagnosis not present

## 2024-04-30 DIAGNOSIS — R7989 Other specified abnormal findings of blood chemistry: Secondary | ICD-10-CM

## 2024-04-30 DIAGNOSIS — Z13220 Encounter for screening for lipoid disorders: Secondary | ICD-10-CM | POA: Diagnosis not present

## 2024-04-30 DIAGNOSIS — Z7985 Long-term (current) use of injectable non-insulin antidiabetic drugs: Secondary | ICD-10-CM

## 2024-04-30 DIAGNOSIS — Z23 Encounter for immunization: Secondary | ICD-10-CM

## 2024-04-30 DIAGNOSIS — Z125 Encounter for screening for malignant neoplasm of prostate: Secondary | ICD-10-CM

## 2024-04-30 DIAGNOSIS — Z0184 Encounter for antibody response examination: Secondary | ICD-10-CM

## 2024-04-30 DIAGNOSIS — E119 Type 2 diabetes mellitus without complications: Secondary | ICD-10-CM | POA: Diagnosis not present

## 2024-04-30 MED ORDER — TIRZEPATIDE 12.5 MG/0.5ML ~~LOC~~ SOAJ
12.5000 mg | SUBCUTANEOUS | 1 refills | Status: DC
  Filled 2024-04-30: qty 2, 28d supply, fill #0
  Filled 2024-05-29: qty 2, 28d supply, fill #1

## 2024-04-30 NOTE — Progress Notes (Signed)
 Kyle Marsh is a 43 y.o. male with the following history as recorded in [employer]:  Patient Active Problem List   Diagnosis Date Noted   Lumbar contusion 07/16/2022   Obesity, morbid, BMI 50 or higher (HCC) 03/22/2022   Low testosterone  in male 09/25/2018   Sleep apnea 09/25/2018   Benign hypertension 02/05/2017   GERD 11/01/2009   WEIGHT GAIN, ABNORMAL 11/01/2009    Current Outpatient Medications  Medication Sig Dispense Refill   amLODipine -valsartan  (EXFORGE ) 10-160 MG tablet Take 1 tablet by mouth daily. 90 tablet 3   atorvastatin  (LIPITOR) 20 MG tablet Take 1 tablet (20 mg total) by mouth daily. 90 tablet 3   cyanocobalamin  (VITAMIN B12) 1000 MCG tablet Take 1,000 mcg by mouth daily.     OneTouch Delica Lancets 33G MISC USE UP TO 4 TIMES A DAY 100 each 1   ONETOUCH ULTRA test strip USE UP TO 4 TIMES A DAY AS DIRECTED 100 strip 1   tirzepatide  (MOUNJARO ) 12.5 MG/0.5ML Pen Inject 12.5 mg into the skin once a week. 2 mL 1   No current facility-administered medications for this visit.    Allergies: Patient has no known allergies.  Past Medical History:  Diagnosis Date   Diabetes mellitus without complication (HCC)    ED (erectile dysfunction)    Hypertension    Morbid obesity (HCC)    OSA on CPAP    Varicose veins of both legs with edema     No past surgical history on file.  Family History  Problem Relation Age of Onset   Arthritis Mother    Diabetes Mother    High blood pressure Mother    Arthritis Father    High Cholesterol Father    High blood pressure Father    Diabetes Maternal Grandmother    Kidney disease Maternal Grandmother    Cancer Paternal Grandmother    Diabetes Paternal Grandmother    High blood pressure Paternal Grandmother    Arthritis Paternal Grandfather    Diabetes Paternal Grandfather     Social History   Tobacco Use   Smoking status: Never   Smokeless tobacco: Never  Substance Use Topics   Alcohol use: No    Subjective:   Presents  for yearly CPE; has done very well on Mounjaro ; no acute concerns today; does wonder if dosage of Mounjaro  can be increased;   Review of Systems  Constitutional:  Positive for weight loss.       Planned weight loss  HENT: Negative.    Eyes: Negative.   Respiratory: Negative.    Cardiovascular: Negative.   Gastrointestinal: Negative.   Genitourinary: Negative.   Musculoskeletal: Negative.   Skin: Negative.   Neurological: Negative.   Endo/Heme/Allergies: Negative.   Psychiatric/Behavioral: Negative.       Objective:  Vitals:   04/30/24 1354 04/30/24 1452  BP: 138/86 130/78  Pulse: 94   SpO2: 97%   Weight: (!) 336 lb (152.4 kg)   Height: 6' 1 (1.854 m)     General: Well developed, well nourished, in no acute distress  Skin : Warm and dry.  Head: Normocephalic and atraumatic  Eyes: Sclera and conjunctiva clear; pupils round and reactive to light; extraocular movements intact  Ears: External normal; canals clear; tympanic membranes normal  Oropharynx: Pink, supple. No suspicious lesions  Neck: Supple without thyromegaly, adenopathy  Lungs: Respirations unlabored; clear to auscultation bilaterally without wheeze, rales, rhonchi  CVS exam: normal rate and regular rhythm.  Abdomen: Soft; nontender; nondistended; normoactive bowel  sounds; no masses or hepatosplenomegaly  Musculoskeletal: No deformities; no active joint inflammation  Extremities: No edema, cyanosis, clubbing  Vessels: Symmetric bilaterally  Neurologic: Alert and oriented; speech intact; face symmetrical; moves all extremities well; CNII-XII intact without focal deficit   Assessment:  1. Immunity status testing   2. PE (physical exam), annual   3. Lipid screening   4. Type 2 diabetes mellitus without complication, without long-term current use of insulin (HCC)   5. Sleep apnea, unspecified type   6. Low testosterone  in male   7. Prostate cancer screening   8. Need for pneumococcal 20-valent conjugate  vaccination     Plan:  Age appropriate preventive healthcare needs addressed; encouraged regular eye doctor and dental exams; encouraged regular exercise; will update labs and refills as needed today;  Order updated for new sleep study/ ? Need for new CPAP machine;  Prevnar 20 updated;  Follow up in 6 months;   No follow-ups on file.  Orders Placed This Encounter  Procedures   Pneumococcal conjugate vaccine 20-valent (Prevnar 20)   Urine Microalbumin w/creat. ratio   PSA    Standing Status:   Future    Number of Occurrences:   1    Expected Date:   04/30/2024    Expiration Date:   04/30/2025   Testosterone  Total,Free,Bio, Males-([facility])    Standing Status:   Future    Number of Occurrences:   1    Expected Date:   04/30/2024    Expiration Date:   04/30/2025   Hepatitis B surface antibody,qualitative    Standing Status:   Future    Number of Occurrences:   1    Expected Date:   04/30/2024    Expiration Date:   04/30/2025   Hemoglobin A1c    Standing Status:   Future    Number of Occurrences:   1    Expected Date:   04/30/2024    Expiration Date:   04/30/2025   Comp Met (CMET)    Standing Status:   Future    Number of Occurrences:   1    Expected Date:   04/30/2024    Expiration Date:   04/30/2025   CBC with Differential/Platelet    Standing Status:   Future    Number of Occurrences:   1    Expected Date:   04/30/2024    Expiration Date:   04/30/2025   Lipid panel    Standing Status:   Future    Number of Occurrences:   1    Expected Date:   04/30/2024    Expiration Date:   04/30/2025   Ambulatory referral to Neurology    Referral Priority:   Routine    Referral Type:   Consultation    Referral Reason:   Specialty Services Required    Requested Specialty:   Neurology    Number of Visits Requested:   1    Requested Prescriptions   Signed Prescriptions Disp Refills   tirzepatide  (MOUNJARO ) 12.5 MG/0.5ML Pen 2 mL 1    Sig: Inject 12.5 mg into the skin once a week.

## 2024-04-30 NOTE — Patient Instructions (Addendum)
 For the constipation with the Mounjaro , try taking OTC Miralax; you can try using every other day to see how you respond.   Make a follow up with the eye doctor on [address];  Referral about sleep study has been updated- they will call you;  Will let you know about testosterone ;  It is okay to take your men's multi-vitamin;

## 2024-05-01 LAB — CBC WITH DIFFERENTIAL/PLATELET
Absolute Lymphocytes: 1839 {cells}/uL (ref 850–3900)
Absolute Monocytes: 462 {cells}/uL (ref 200–950)
Basophils Absolute: 48 {cells}/uL (ref 0–200)
Basophils Relative: 1.1 %
Eosinophils Absolute: 70 {cells}/uL (ref 15–500)
Eosinophils Relative: 1.6 %
HCT: 43.2 % (ref 38.5–50.0)
Hemoglobin: 13.7 g/dL (ref 13.2–17.1)
MCH: 28.9 pg (ref 27.0–33.0)
MCHC: 31.7 g/dL — ABNORMAL LOW (ref 32.0–36.0)
MCV: 91.1 fL (ref 80.0–100.0)
MPV: 11.2 fL (ref 7.5–12.5)
Monocytes Relative: 10.5 %
Neutro Abs: 1980 {cells}/uL (ref 1500–7800)
Neutrophils Relative %: 45 %
Platelets: 225 Thousand/uL (ref 140–400)
RBC: 4.74 Million/uL (ref 4.20–5.80)
RDW: 12.6 % (ref 11.0–15.0)
Total Lymphocyte: 41.8 %
WBC: 4.4 Thousand/uL (ref 3.8–10.8)

## 2024-05-01 LAB — COMPREHENSIVE METABOLIC PANEL WITH GFR
AG Ratio: 1.3 (calc) (ref 1.0–2.5)
ALT: 25 U/L (ref 9–46)
AST: 18 U/L (ref 10–40)
Albumin: 4.4 g/dL (ref 3.6–5.1)
Alkaline phosphatase (APISO): 60 U/L (ref 36–130)
BUN: 13 mg/dL (ref 7–25)
CO2: 25 mmol/L (ref 20–32)
Calcium: 9.4 mg/dL (ref 8.6–10.3)
Chloride: 101 mmol/L (ref 98–110)
Creat: 0.99 mg/dL (ref 0.60–1.29)
Globulin: 3.4 g/dL (ref 1.9–3.7)
Glucose, Bld: 81 mg/dL (ref 65–99)
Potassium: 4.7 mmol/L (ref 3.5–5.3)
Sodium: 139 mmol/L (ref 135–146)
Total Bilirubin: 0.5 mg/dL (ref 0.2–1.2)
Total Protein: 7.8 g/dL (ref 6.1–8.1)
eGFR: 98 mL/min/1.73m2 (ref >=60)

## 2024-05-01 LAB — LIPID PANEL
Cholesterol: 119 mg/dL (ref <200)
HDL: 43 mg/dL (ref >=40)
LDL Cholesterol (Calc): 61 mg/dL
Non-HDL Cholesterol (Calc): 76 mg/dL (ref <130)
Total CHOL/HDL Ratio: 2.8 (calc) (ref <5.0)
Triglycerides: 72 mg/dL (ref <150)

## 2024-05-01 LAB — HEMOGLOBIN A1C
Hgb A1c MFr Bld: 5.1 % (ref <5.7)
Mean Plasma Glucose: 100 mg/dL
eAG (mmol/L): 5.5 mmol/L

## 2024-05-01 LAB — HEPATITIS B SURFACE ANTIBODY,QUALITATIVE: Hep B S Ab: NONREACTIVE

## 2024-05-01 LAB — TESTOSTERONE TOTAL,FREE,BIO, MALES
Albumin: 4.4 g/dL (ref 3.6–5.1)
Sex Hormone Binding: 22 nmol/L (ref 10–50)
Testosterone, Bioavailable: 118.3 ng/dL (ref 110.0–575.0)
Testosterone, Free: 58.7 pg/mL (ref 46.0–224.0)
Testosterone: 333 ng/dL (ref 250–827)

## 2024-05-01 LAB — PSA: PSA: 0.16 ng/mL (ref <=4.00)

## 2024-05-01 LAB — MICROALBUMIN / CREATININE URINE RATIO
Creatinine, Urine: 237 mg/dL (ref 20–320)
Microalb Creat Ratio: 3 mg/g{creat} (ref <30)
Microalb, Ur: 0.7 mg/dL

## 2024-05-03 ENCOUNTER — Ambulatory Visit: Payer: Self-pay | Admitting: Family

## 2024-05-06 ENCOUNTER — Other Ambulatory Visit (HOSPITAL_COMMUNITY): Payer: Self-pay

## 2024-06-29 ENCOUNTER — Other Ambulatory Visit: Payer: Self-pay | Admitting: Family

## 2024-06-29 ENCOUNTER — Other Ambulatory Visit (HOSPITAL_COMMUNITY): Payer: Self-pay

## 2024-06-29 MED ORDER — MOUNJARO 12.5 MG/0.5ML ~~LOC~~ SOAJ
12.5000 mg | SUBCUTANEOUS | 1 refills | Status: DC
  Filled 2024-06-29: qty 2, 28d supply, fill #0
  Filled 2024-07-25: qty 2, 28d supply, fill #1

## 2024-07-01 ENCOUNTER — Encounter: Payer: Self-pay | Admitting: Family

## 2024-07-26 ENCOUNTER — Other Ambulatory Visit (HOSPITAL_COMMUNITY): Payer: Self-pay

## 2024-07-30 ENCOUNTER — Other Ambulatory Visit (HOSPITAL_COMMUNITY): Payer: Self-pay

## 2024-08-22 ENCOUNTER — Other Ambulatory Visit: Payer: Self-pay

## 2024-09-01 ENCOUNTER — Telehealth: Payer: Self-pay

## 2024-09-01 NOTE — Telephone Encounter (Signed)
 Copied from CRM [id number]. Topic: Clinical - Medication Question >> Sep 01, 2024  8:07 AM Eva FALCON wrote: Reason for CRM: Pt wife Revonda was calling in for [name], he is out of the medication and his PCP Leita Elbe is no longer at the practice. She states he has not established elsewhere since he is unsure who he wants to go with. They were wondering if someone in office could refill for him until he finds someone, tried explaining that he needs to establish care either in office or elsewhere so they can continue prescribing meds, but insisted on having a message sent. Please call pt himself with any more questions or concerns.

## 2024-09-02 ENCOUNTER — Telehealth: Payer: Self-pay | Admitting: Family

## 2024-09-02 NOTE — Telephone Encounter (Signed)
 Pt is dropping off forms that need to be filled out for work by [employer]. Pcp was lm who left and he is unsure who he wants to establish with. He also needs refills of his medicine monjaro that he is on and was not sure if he could get that without an office visit. Please advise.

## 2024-09-02 NOTE — Telephone Encounter (Signed)
 Disregard

## 2024-09-03 ENCOUNTER — Other Ambulatory Visit: Payer: Self-pay | Admitting: Family Medicine

## 2024-09-03 MED ORDER — MOUNJARO 12.5 MG/0.5ML ~~LOC~~ SOAJ: 12.5000 mg | SUBCUTANEOUS | 1 refills | Status: DC

## 2024-09-06 NOTE — Telephone Encounter (Signed)
 He has a toc scheduled with Jessica Yacopino

## 2024-09-10 ENCOUNTER — Encounter: Payer: Self-pay | Admitting: Student

## 2024-09-10 ENCOUNTER — Ambulatory Visit (HOSPITAL_BASED_OUTPATIENT_CLINIC_OR_DEPARTMENT_OTHER)
Admission: RE | Admit: 2024-09-10 | Discharge: 2024-09-10 | Disposition: A | Discharge status: Home / Self Care | Source: Ambulatory Visit | Attending: Student | Admitting: Student

## 2024-09-10 ENCOUNTER — Other Ambulatory Visit (HOSPITAL_COMMUNITY): Payer: Self-pay

## 2024-09-10 ENCOUNTER — Ambulatory Visit: Admitting: Student

## 2024-09-10 VITALS — BP 113/74 | HR 75 | Ht 73.0 in | Wt 337.2 lb

## 2024-09-10 DIAGNOSIS — Z23 Encounter for immunization: Secondary | ICD-10-CM

## 2024-09-10 DIAGNOSIS — M79671 Pain in right foot: Secondary | ICD-10-CM | POA: Insufficient documentation

## 2024-09-10 DIAGNOSIS — E119 Type 2 diabetes mellitus without complications: Secondary | ICD-10-CM | POA: Insufficient documentation

## 2024-09-10 DIAGNOSIS — Z6841 Body Mass Index (BMI) 40.0 and over, adult: Secondary | ICD-10-CM

## 2024-09-10 DIAGNOSIS — Z0000 Encounter for general adult medical examination without abnormal findings: Secondary | ICD-10-CM

## 2024-09-10 DIAGNOSIS — I10 Essential (primary) hypertension: Secondary | ICD-10-CM

## 2024-09-10 DIAGNOSIS — G4730 Sleep apnea, unspecified: Secondary | ICD-10-CM

## 2024-09-10 DIAGNOSIS — E785 Hyperlipidemia, unspecified: Secondary | ICD-10-CM | POA: Insufficient documentation

## 2024-09-10 DIAGNOSIS — E66813 Obesity, class 3: Secondary | ICD-10-CM

## 2024-09-10 DIAGNOSIS — Z7985 Long-term (current) use of injectable non-insulin antidiabetic drugs: Secondary | ICD-10-CM

## 2024-09-10 MED ORDER — TIRZEPATIDE 15 MG/0.5ML ~~LOC~~ SOAJ
15.0000 mg | SUBCUTANEOUS | 3 refills | Status: AC
  Filled 2024-09-10: qty 2, 28d supply, fill #0
  Filled 2024-09-26: qty 6, 84d supply, fill #0
  Filled 2024-10-23: qty 2, 28d supply, fill #1
  Filled 2024-11-20: qty 2, 28d supply, fill #2

## 2024-09-10 NOTE — Assessment & Plan Note (Addendum)
 Encouraged DASH or MIND diet, decrease po intake and increase exercise as tolerated. Needs 7-8 hours of sleep nightly. Avoid trans fats, eat small, frequent meals every 4-5 hours with lean proteins, complex carbs and healthy fats. Minimize simple carbs, high fat foods and processed foods   -Rx- Increase Mounjaro  15 mg inj weekly

## 2024-09-10 NOTE — Progress Notes (Signed)
 Subjective:     Patient ID: Kyle Marsh, male    DOB: 1980-12-01, 43 y.o.   MRN: [id number]  No chief complaint on file.   HPI  Discussed the use of AI scribe software for clinical note transcription with the patient, who gave verbal consent to proceed.  History of Present Illness Kyle Marsh is a 43 year old male who presents for TOC and follow up chronic conditions.   He is transitioning to a [employer] ([employer]) and requires an updated A1c result. His last A1c test was in July.  He takes amlodipine , losartan , and valsartan  (Exforge ) for hypertension every morning. His blood pressure readings at home have been stable.  He experiences pain at the top of his right foot for about two months, described as soreness, especially when flexing the foot, walking, without any known injury. Able to bare weight.  HTN Amlodipine -valsartan  (EXFORGE ) 10-160 milligrams daily BP at home  HLD-atorvastatin  20 mg daily  Diabetes: - Medications: Tirzepatide  (Mounjaro ) 12.5 mg - Compliant with medication. Denies adverse SEs - Denies symptoms of hypoglycemia, polyuria, polydipsia, numbness extremities, foot ulcers/trauma, visual changes, wounds that are not healing, medication side effects   Wt Readings from Last 3 Encounters:  09/10/24 (!) 337 lb 3.2 oz (153 kg)  04/30/24 (!) 336 lb (152.4 kg)  09/09/23 (!) 362 lb (164.2 kg)     Patient denies fever, chills, SOB, CP, palpitations, dyspnea, edema, HA, vision changes, N/V/D, abdominal pain, urinary symptoms, rash, weight changes, and recent illness or hospitalizations.   History of Present Illness              Health Maintenance Due  Topic Date Due   FOOT EXAM  Never done   OPHTHALMOLOGY EXAM  Never done   HIV Screening  Never done   Hepatitis C Screening  Never done   HPV VACCINES (1 - 3-dose SCDM series) Never done    Past Medical History:  Diagnosis Date   Diabetes mellitus without complication (HCC)    ED  (erectile dysfunction)    Hypertension    Morbid obesity (HCC)    OSA on CPAP    Varicose veins of both legs with edema     History reviewed. No pertinent surgical history.  Family History  Problem Relation Age of Onset   Arthritis Mother    Diabetes Mother    High blood pressure Mother    Arthritis Father    High Cholesterol Father    High blood pressure Father    Diabetes Maternal Grandmother    Kidney disease Maternal Grandmother    Cancer Paternal Grandmother    Diabetes Paternal Grandmother    High blood pressure Paternal Grandmother    Arthritis Paternal Grandfather    Diabetes Paternal Grandfather     Social History   Socioeconomic History   Marital status: Married    Spouse name: Not on file   Number of children: Not on file   Years of education: Not on file   Highest education level: Not on file  Occupational History   Not on file  Tobacco Use   Smoking status: Never   Smokeless tobacco: Never  Substance and Sexual Activity   Alcohol use: No   Drug use: No   Sexual activity: Not on file  Other Topics Concern   Not on file  Social History Narrative   Not on file   Social Drivers of Health   Financial Resource Strain: Not on file  Food Insecurity: Not on file  Transportation Needs: Not on file  Physical Activity: Not on file  Stress: Not on file  Social Connections: Unknown (06/25/2022)   [facility]   Social Network    Social Network: Not on file  Intimate Partner Violence: Unknown (06/25/2022)   Received from [facility]   HITS    Physically Hurt: Not on file    Insult or Talk Down To: Not on file    Threaten Physical Harm: Not on file    Scream or Curse: Not on file    Outpatient Medications Prior to Visit  Medication Sig Dispense Refill   amLODipine -valsartan  (EXFORGE ) 10-160 MG tablet Take 1 tablet by mouth daily. 90 tablet 3   atorvastatin  (LIPITOR) 20 MG tablet Take 1 tablet (20 mg total) by mouth daily. 90 tablet 3    cyanocobalamin  (VITAMIN B12) 1000 MCG tablet Take 1,000 mcg by mouth daily.     OneTouch Delica Lancets 33G MISC USE UP TO 4 TIMES A DAY 100 each 1   ONETOUCH ULTRA test strip USE UP TO 4 TIMES A DAY AS DIRECTED 100 strip 1   tirzepatide  (MOUNJARO ) 12.5 MG/0.5ML Pen Inject 12.5 mg into the skin once a week. 2 mL 1   No facility-administered medications prior to visit.    No Known Allergies  ROS See HPI    Objective:    Physical Exam Vitals reviewed.  Constitutional:      General: He is not in acute distress.    Appearance: He is obese. He is not toxic-appearing.  HENT:     Head: Normocephalic and atraumatic.     Mouth/Throat:     Mouth: Mucous membranes are moist.     Pharynx: Oropharynx is clear.  Eyes:     Pupils: Pupils are equal, round, and reactive to light.  Cardiovascular:     Rate and Rhythm: Normal rate and regular rhythm.     Pulses: Normal pulses.     Heart sounds: Normal heart sounds. No murmur heard. Pulmonary:     Effort: Pulmonary effort is normal. No respiratory distress.     Breath sounds: Normal breath sounds. No wheezing.  Musculoskeletal:        General: No swelling. Normal range of motion.     Cervical back: Neck supple.     Comments: Right foot: Full ROM, mild pain with full R ankle extension, mild TWP  Skin:    General: Skin is warm and dry.  Neurological:     General: No focal deficit present.     Mental Status: He is alert and oriented to person, place, and time.  Psychiatric:        Mood and Affect: Mood normal.        Behavior: Behavior normal.        Thought Content: Thought content normal.        Judgment: Judgment normal.      BP 113/74   Pulse 75   Ht 6' 1 (1.854 m)   Wt (!) 337 lb 3.2 oz (153 kg)   SpO2 99%   BMI 44.49 kg/m  Wt Readings from Last 3 Encounters:  09/10/24 (!) 337 lb 3.2 oz (153 kg)  04/30/24 (!) 336 lb (152.4 kg)  09/09/23 (!) 362 lb (164.2 kg)       Assessment & Plan:   Problem List Items Addressed  This Visit     Benign hypertension - Primary   Well controlled, no changes to  meds. Encouraged heart healthy diet such as the DASH diet and exercise as tolerated.        Class 3 severe obesity with serious comorbidity and body mass index (BMI) of 40.0 to 44.9 in adult [facility])    Encouraged DASH or MIND diet, decrease po intake and increase exercise as tolerated. Needs 7-8 hours of sleep nightly. Avoid trans fats, eat small, frequent meals every 4-5 hours with lean proteins, complex carbs and healthy fats. Minimize simple carbs, high fat foods and processed foods   -Rx- Increase Mounjaro  15 mg inj weekly      Relevant Medications   tirzepatide  (MOUNJARO ) 15 MG/0.5ML Pen   Hyperlipidemia   Encourage heart healthy diet such as MIND or DASH diet, increase exercise, avoid trans fats, simple carbohydrates and processed foods, consider a krill or fish or flaxseed oil cap daily.        Right foot pain   Relevant Orders   DG Foot Complete Right   Sleep apnea   Pt reports using CPAP nightly >4 hours per night.  Referral to Pulmonology for CPAP maintenance      Relevant Orders   Ambulatory referral to Pulmonology   Type 2 diabetes mellitus without complication, without long-term current use of insulin (HCC)   Managed with Mounjaro  On statin Last hgba1c acceptable, minimize simple carbs. Increase exercise as tolerated. Continue current meds  Repeat A1C Referred to Ophthalmology, encourage yearly DM eye exam FU 6 months      Relevant Medications   tirzepatide  (MOUNJARO ) 15 MG/0.5ML Pen   Other Relevant Orders   HgB A1c   Ambulatory referral to Ophthalmology   Other Visit Diagnoses       Preventative health care       Relevant Orders   Hepatitis C Antibody   HIV antibody (with reflex)     Need for influenza vaccination       Relevant Orders   Flu vaccine trivalent PF, 6mos and older(Flulaval,Afluria,Fluarix,Fluzone) (Completed)      Assessment and Plan - flu shot given -  Encouraged annual diabetic eye exam. - Discussed hepatitis B vaccination series, Pt declines.  Pain in right foot Pain x 2 months - Ordered x-ray of right foot. - Advised wearing supportive shoes and using compression. - Consider Sports Medicine referral if pain persist or worsens  FU 6 months for CPE   I have discontinued Deward PARAS. Hastings's Mounjaro . I am also having him start on tirzepatide . Additionally, I am having him maintain his OneTouch Ultra, OneTouch Delica Lancets 33G, cyanocobalamin , amLODipine -valsartan , and atorvastatin .  Meds ordered this encounter  Medications   tirzepatide  (MOUNJARO ) 15 MG/0.5ML Pen    Sig: Inject 15 mg into the skin once a week.    Dispense:  6 mL    Refill:  3    Supervising Provider:   DOMENICA BLACKBIRD A [[id number]]

## 2024-09-10 NOTE — Assessment & Plan Note (Signed)
 Pt reports using CPAP nightly >4 hours per night.  Referral to Pulmonology for CPAP maintenance

## 2024-09-10 NOTE — Assessment & Plan Note (Signed)
 Encourage heart healthy diet such as MIND or DASH diet, increase exercise, avoid trans fats, simple carbohydrates and processed foods, consider a krill or fish or flaxseed oil cap daily.

## 2024-09-10 NOTE — Assessment & Plan Note (Signed)
 Stable on Mounjaro . Encouraged DASH or MIND diet, decrease po intake and increase exercise as tolerated. Needs 7-8 hours of sleep nightly. Avoid trans fats, eat small, frequent meals every 4-5 hours with lean proteins, complex carbs and healthy fats. Minimize simple carbs, high fat foods and processed foods

## 2024-09-10 NOTE — Assessment & Plan Note (Signed)
 Well controlled, no changes to meds. Encouraged heart healthy diet such as the DASH diet and exercise as tolerated.

## 2024-09-10 NOTE — Patient Instructions (Signed)
 d

## 2024-09-10 NOTE — Assessment & Plan Note (Addendum)
 Managed with Mounjaro  On statin Last hgba1c acceptable, minimize simple carbs. Increase exercise as tolerated. Continue current meds  Repeat A1C Referred to Ophthalmology, encourage yearly DM eye exam FU 6 months

## 2024-09-11 LAB — HEPATITIS C ANTIBODY: Hepatitis C Ab: NONREACTIVE

## 2024-09-11 LAB — HEMOGLOBIN A1C
Hgb A1c MFr Bld: 5 % (ref <5.7)
Mean Plasma Glucose: 97 mg/dL
eAG (mmol/L): 5.4 mmol/L

## 2024-09-11 LAB — HIV ANTIBODY (ROUTINE TESTING W REFLEX)
HIV 1&2 Ab, 4th Generation: NONREACTIVE
HIV FINAL INTERPRETATION: NEGATIVE

## 2024-09-13 ENCOUNTER — Ambulatory Visit: Payer: Self-pay | Admitting: Student

## 2024-09-21 ENCOUNTER — Other Ambulatory Visit (HOSPITAL_COMMUNITY): Payer: Self-pay

## 2024-09-23 ENCOUNTER — Ambulatory Visit: Admitting: Student

## 2024-09-27 ENCOUNTER — Other Ambulatory Visit (HOSPITAL_COMMUNITY): Payer: Self-pay

## 2024-09-27 ENCOUNTER — Other Ambulatory Visit: Payer: Self-pay

## 2024-10-23 ENCOUNTER — Encounter: Payer: Self-pay | Admitting: Student

## 2024-11-03 ENCOUNTER — Other Ambulatory Visit (HOSPITAL_COMMUNITY): Payer: Self-pay

## 2024-11-04 ENCOUNTER — Other Ambulatory Visit (HOSPITAL_COMMUNITY): Payer: Self-pay

## 2024-11-04 ENCOUNTER — Other Ambulatory Visit: Payer: Self-pay

## 2024-12-23 ENCOUNTER — Ambulatory Visit: Admitting: Family Medicine

## 2025-05-03 ENCOUNTER — Encounter: Admitting: Student
